# Patient Record
Sex: Female | Born: 1960 | Race: White | Hispanic: No | Marital: Married | Smoking: Former smoker
Health system: Southern US, Community
[De-identification: ages and names within clinical notes are randomized; demographics above are authoritative.]

## PROBLEM LIST (undated history)

## (undated) DIAGNOSIS — Z973 Presence of spectacles and contact lenses: Secondary | ICD-10-CM

## (undated) DIAGNOSIS — I10 Essential (primary) hypertension: Secondary | ICD-10-CM

## (undated) DIAGNOSIS — E119 Type 2 diabetes mellitus without complications: Secondary | ICD-10-CM

## (undated) DIAGNOSIS — K219 Gastro-esophageal reflux disease without esophagitis: Secondary | ICD-10-CM

## (undated) DIAGNOSIS — Z9889 Other specified postprocedural states: Secondary | ICD-10-CM

## (undated) DIAGNOSIS — R112 Nausea with vomiting, unspecified: Secondary | ICD-10-CM

## (undated) DIAGNOSIS — E7800 Pure hypercholesterolemia, unspecified: Secondary | ICD-10-CM

## (undated) DIAGNOSIS — M5020 Other cervical disc displacement, unspecified cervical region: Secondary | ICD-10-CM

## (undated) HISTORY — PX: COLONOSCOPY W/ BIOPSIES AND POLYPECTOMY: SHX1376

## (undated) HISTORY — PX: FRACTURE SURGERY: SHX138

## (undated) HISTORY — PX: CARPAL TUNNEL RELEASE: SHX101

## (undated) HISTORY — PX: CERVICAL FUSION: SHX112

## (undated) HISTORY — PX: TUBAL LIGATION: SHX77

---

## 2002-10-22 ENCOUNTER — Ambulatory Visit (HOSPITAL_COMMUNITY): Admission: RE | Admit: 2002-10-22 | Discharge: 2002-10-23 | Payer: Self-pay | Admitting: Orthopaedic Surgery

## 2002-10-22 ENCOUNTER — Encounter: Payer: Self-pay | Admitting: Orthopaedic Surgery

## 2002-10-23 ENCOUNTER — Encounter: Payer: Self-pay | Admitting: Orthopaedic Surgery

## 2003-04-30 ENCOUNTER — Encounter: Admission: RE | Admit: 2003-04-30 | Discharge: 2003-04-30 | Payer: Self-pay | Admitting: Obstetrics and Gynecology

## 2004-09-12 ENCOUNTER — Ambulatory Visit (HOSPITAL_COMMUNITY): Admission: RE | Admit: 2004-09-12 | Discharge: 2004-09-12 | Payer: Self-pay | Admitting: Orthopaedic Surgery

## 2005-10-09 ENCOUNTER — Encounter: Admission: RE | Admit: 2005-10-09 | Discharge: 2005-10-09 | Payer: Self-pay | Admitting: Orthopedic Surgery

## 2007-02-01 IMAGING — CT CT EXTREM LOW W/O CM*L*
3 series · 16 of 35 positions shown, 19 images · IV contrast (agent unspecified)
Comparison: none

CLINICAL DATA: Plantar surface left great toe pain at the MTP joint for one year. 
LEFT ANKLE/FOOT CT ? NO CONTRAST:
TECHNIQUE: Technique:  Multidetector CT imaging was performed according to the standard protocol.  Multiplanar CT image reconstructions were also generated.
No comparison.

[Series 4: recon 3: left foot · axial · 0.31mm/px · z∈[-271,-72]mm · 8 of 377 slices shown, 10 images]
[im 29/377  soft-tissue]
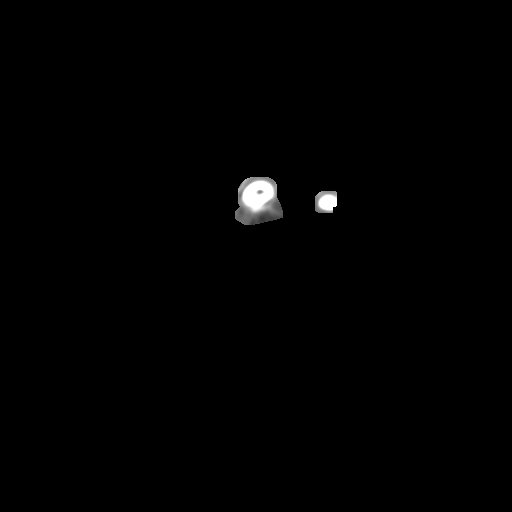
[im 29/377  bone]
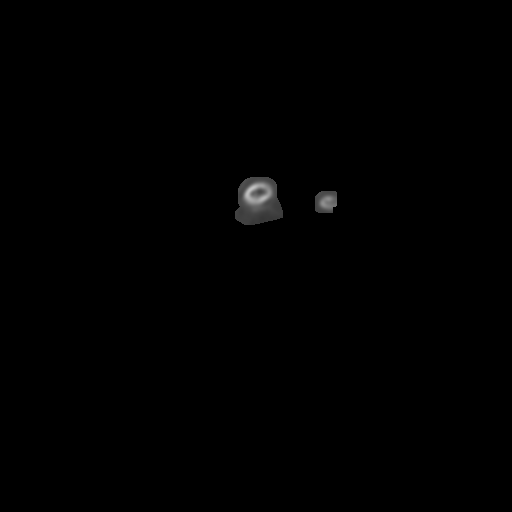
[im 87/377  bone]
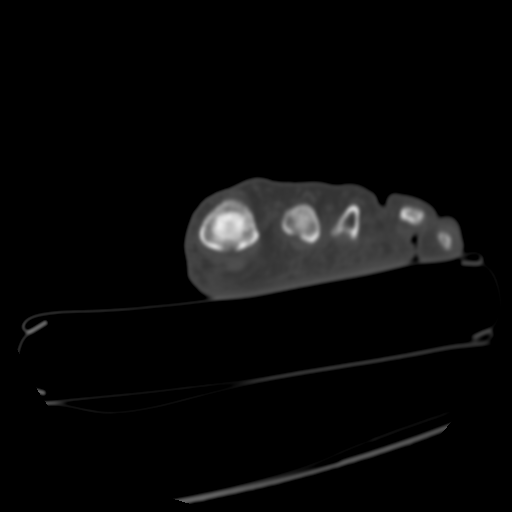
[im 116/377  bone]
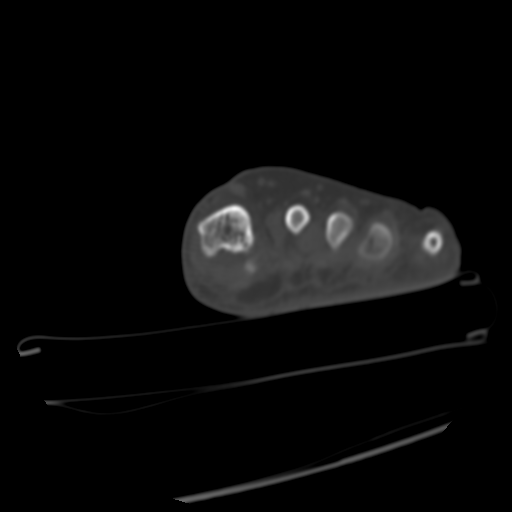
[im 174/377  bone]
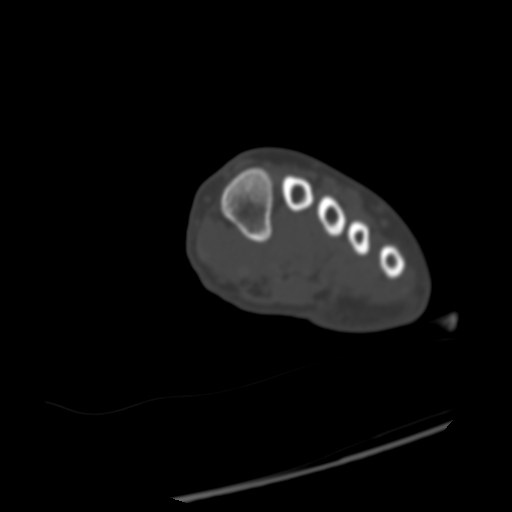
[im 203/377  soft-tissue]
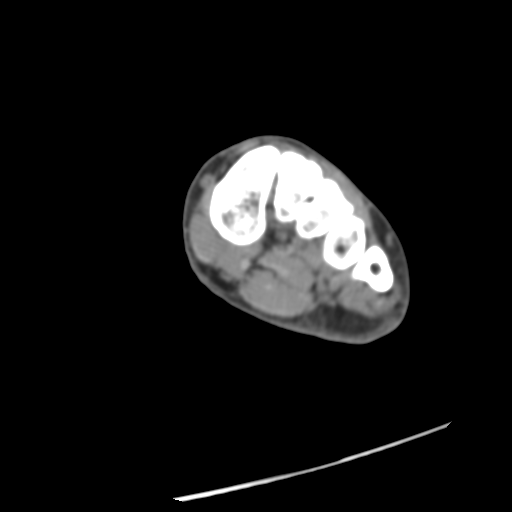
[im 203/377  bone]
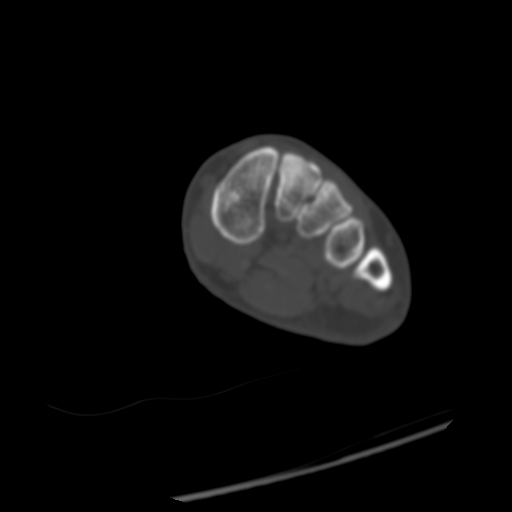
[im 261/377  bone]
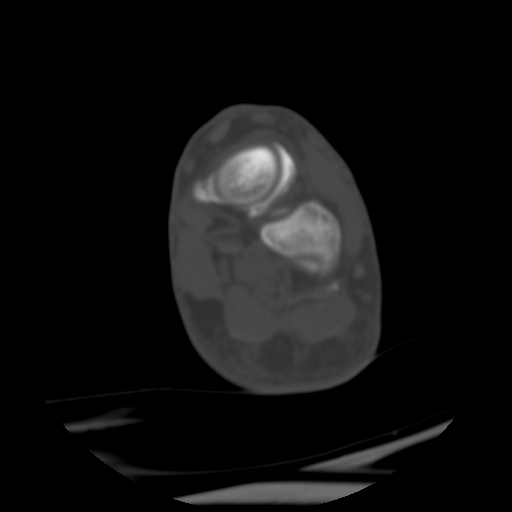
[im 290/377  bone]
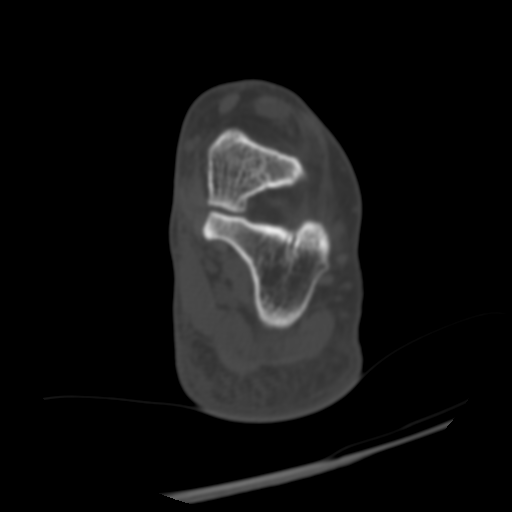
[im 348/377  bone]
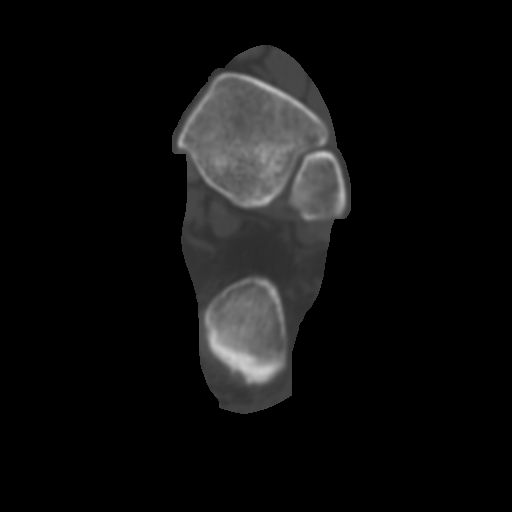

[Series 945: reformatted · coronal · 0.46mm/px · 3 of 69 slices shown (1 of 2)]
[im 14/69  bone]
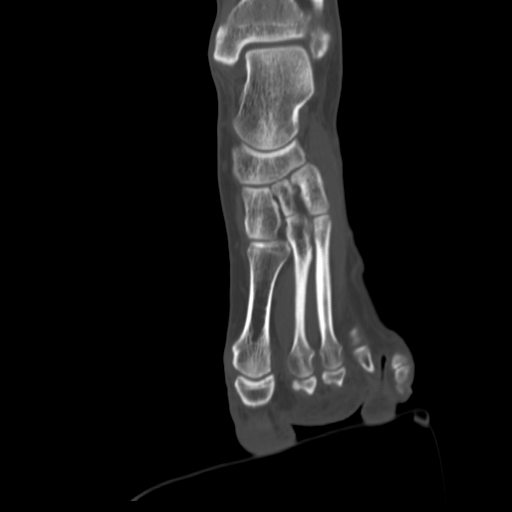
[im 28/69  bone]
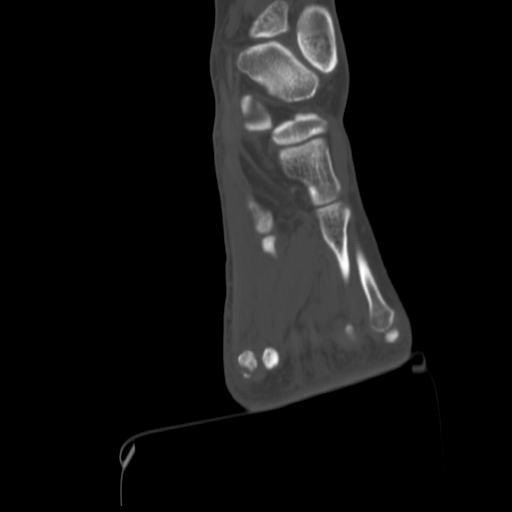
[im 41/69  bone]
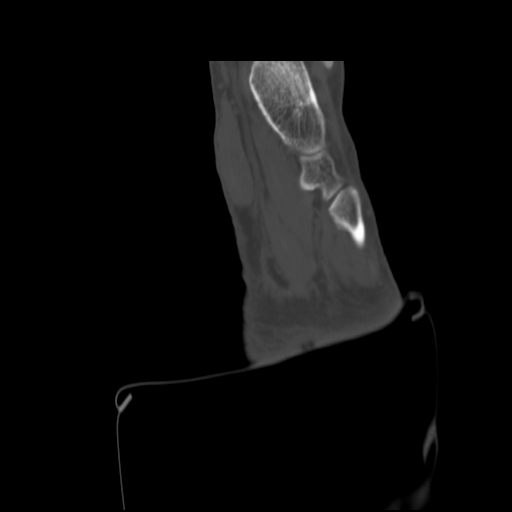

[Series 947: reformatted · sagittal · 0.46mm/px · 5 of 90 slices shown, 6 images (2 of 2)]
[im 30/90  bone]
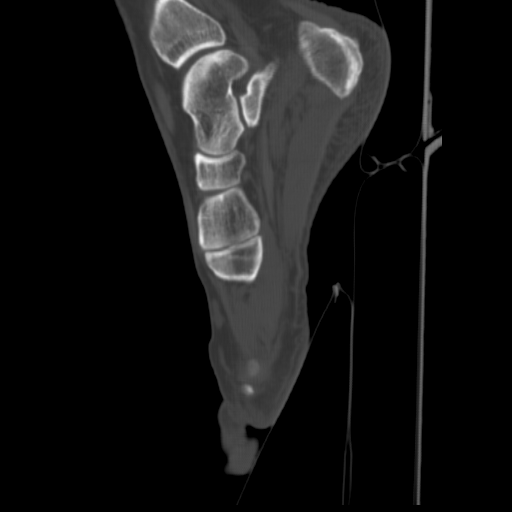
[im 38/90  bone]
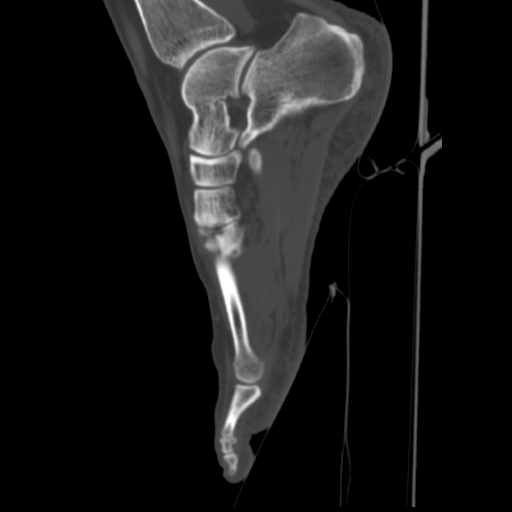
[im 45/90  soft-tissue]
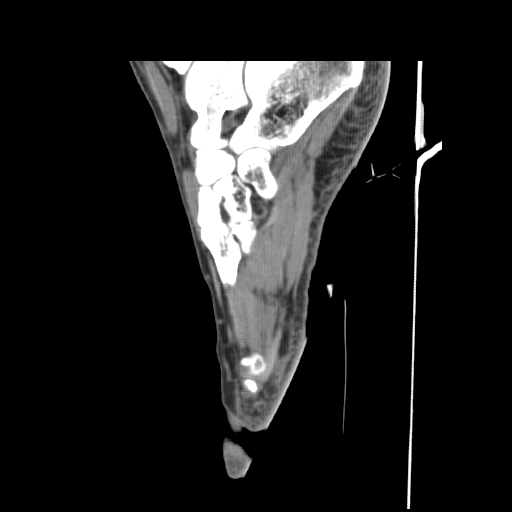
[im 45/90  bone]
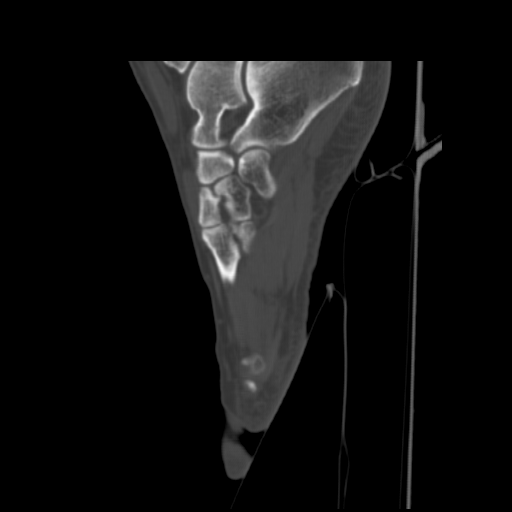
[im 52/90  bone]
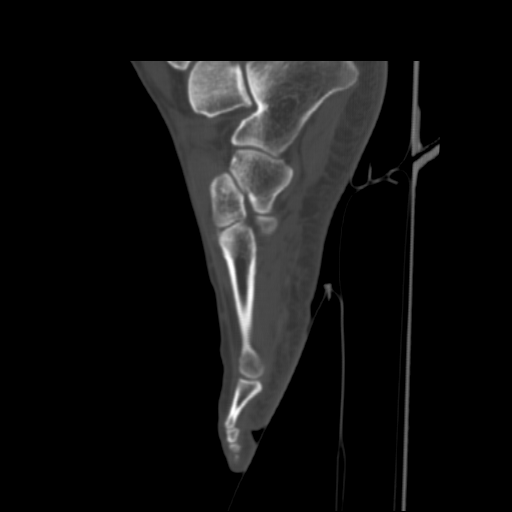
[im 60/90  bone]
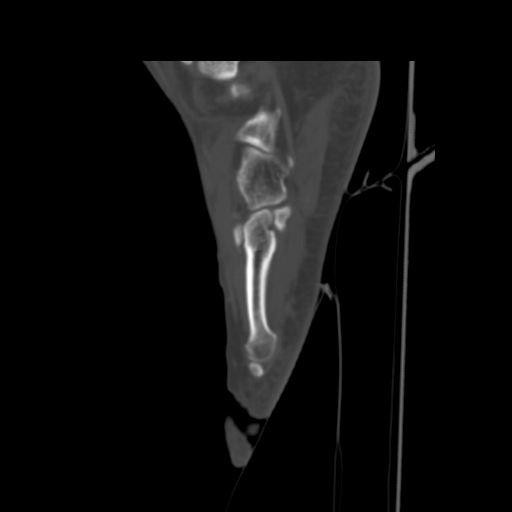

[16 of 35 positions shown; findings below may reference images not displayed]

FINDINGS: Findings consistent with 2 to 3 mm displaced medial sesamoid fracture at the level of plantar surface right first metatarsophalangeal joint is seen.  No smooth cortex for developmental bipartite sesamoid is seen.  No other significant CT abnormality is seen (if great concern for possible so called "turf toe", then consider MRI assessment).  No other significant osseous, articular, nor soft tissue abnormality is seen.
IMPRESSION: 1.  Slightly displaced (2 to 3 mm) medial great toe sesamoid fracture.
2.  Otherwise no significant abnormality.

3-DIMENSIONAL CT IMAGE RENDERING ON INDEPENDENT WORK STATION:
3-dimensional CT images were rendered by post-processing of the original CT data on an independent work station.  The 3-dimensional CT images were interpreted, and findings were reported in the accompanying complete CT report for this study.

## 2015-10-10 ENCOUNTER — Encounter: Payer: Self-pay | Admitting: Gastroenterology

## 2015-10-10 DIAGNOSIS — K648 Other hemorrhoids: Secondary | ICD-10-CM | POA: Diagnosis not present

## 2015-10-10 DIAGNOSIS — Z1211 Encounter for screening for malignant neoplasm of colon: Secondary | ICD-10-CM | POA: Diagnosis not present

## 2015-10-10 DIAGNOSIS — Z7984 Long term (current) use of oral hypoglycemic drugs: Secondary | ICD-10-CM | POA: Diagnosis not present

## 2015-10-10 DIAGNOSIS — E669 Obesity, unspecified: Secondary | ICD-10-CM | POA: Diagnosis not present

## 2015-10-10 DIAGNOSIS — Z6831 Body mass index (BMI) 31.0-31.9, adult: Secondary | ICD-10-CM | POA: Diagnosis not present

## 2015-10-10 DIAGNOSIS — K5730 Diverticulosis of large intestine without perforation or abscess without bleeding: Secondary | ICD-10-CM | POA: Diagnosis not present

## 2015-10-10 DIAGNOSIS — Z8371 Family history of colonic polyps: Secondary | ICD-10-CM | POA: Diagnosis not present

## 2015-10-10 DIAGNOSIS — E119 Type 2 diabetes mellitus without complications: Secondary | ICD-10-CM | POA: Diagnosis not present

## 2015-10-10 DIAGNOSIS — Z86010 Personal history of colonic polyps: Secondary | ICD-10-CM | POA: Diagnosis not present

## 2015-10-10 DIAGNOSIS — D123 Benign neoplasm of transverse colon: Secondary | ICD-10-CM | POA: Diagnosis not present

## 2015-10-10 HISTORY — PX: COLONOSCOPY W/ BIOPSIES AND POLYPECTOMY: SHX1376

## 2015-11-04 DIAGNOSIS — E118 Type 2 diabetes mellitus with unspecified complications: Secondary | ICD-10-CM | POA: Diagnosis not present

## 2015-11-11 DIAGNOSIS — E1165 Type 2 diabetes mellitus with hyperglycemia: Secondary | ICD-10-CM | POA: Diagnosis not present

## 2015-11-11 DIAGNOSIS — E782 Mixed hyperlipidemia: Secondary | ICD-10-CM | POA: Diagnosis not present

## 2015-11-11 DIAGNOSIS — I10 Essential (primary) hypertension: Secondary | ICD-10-CM | POA: Diagnosis not present

## 2015-11-18 DIAGNOSIS — D510 Vitamin B12 deficiency anemia due to intrinsic factor deficiency: Secondary | ICD-10-CM | POA: Diagnosis not present

## 2016-11-26 DIAGNOSIS — Z6830 Body mass index (BMI) 30.0-30.9, adult: Secondary | ICD-10-CM | POA: Diagnosis not present

## 2016-11-26 DIAGNOSIS — S161XXA Strain of muscle, fascia and tendon at neck level, initial encounter: Secondary | ICD-10-CM | POA: Diagnosis not present

## 2016-12-03 DIAGNOSIS — M5021 Other cervical disc displacement,  high cervical region: Secondary | ICD-10-CM | POA: Diagnosis not present

## 2016-12-03 DIAGNOSIS — M50223 Other cervical disc displacement at C6-C7 level: Secondary | ICD-10-CM | POA: Diagnosis not present

## 2016-12-10 ENCOUNTER — Ambulatory Visit (INDEPENDENT_AMBULATORY_CARE_PROVIDER_SITE_OTHER): Payer: BLUE CROSS/BLUE SHIELD | Admitting: Specialist

## 2016-12-10 ENCOUNTER — Encounter (INDEPENDENT_AMBULATORY_CARE_PROVIDER_SITE_OTHER): Payer: Self-pay | Admitting: Specialist

## 2016-12-10 VITALS — BP 135/87 | HR 82 | Ht 67.0 in | Wt 202.0 lb

## 2016-12-10 DIAGNOSIS — M542 Cervicalgia: Secondary | ICD-10-CM

## 2016-12-10 DIAGNOSIS — Z981 Arthrodesis status: Secondary | ICD-10-CM | POA: Diagnosis not present

## 2016-12-10 DIAGNOSIS — M4722 Other spondylosis with radiculopathy, cervical region: Secondary | ICD-10-CM | POA: Diagnosis not present

## 2016-12-10 DIAGNOSIS — R200 Anesthesia of skin: Secondary | ICD-10-CM

## 2016-12-10 DIAGNOSIS — R202 Paresthesia of skin: Secondary | ICD-10-CM

## 2016-12-10 MED ORDER — NAPROXEN 500 MG PO TABS: 500.0000 mg | ORAL_TABLET | Freq: Two times a day (BID) | ORAL | 1 refills | Status: DC

## 2016-12-10 MED ORDER — GABAPENTIN 100 MG PO CAPS: 100.0000 mg | ORAL_CAPSULE | Freq: Three times a day (TID) | ORAL | 1 refills | Status: DC

## 2016-12-10 MED ORDER — METAXALONE 800 MG PO TABS: 400.0000 mg | ORAL_TABLET | Freq: Three times a day (TID) | ORAL | 0 refills | Status: AC
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
## 2016-12-21 ENCOUNTER — Telehealth (INDEPENDENT_AMBULATORY_CARE_PROVIDER_SITE_OTHER): Payer: Self-pay

## 2016-12-21 NOTE — Telephone Encounter (Signed)
Ruth Martin with [facility].  Would like a copy of patient's last office visit and copy of the order with the ICD 10 codes faxed to [phone].  Please fax by Tuesday patient has appointment on Wednesday.  Please advise. Thank You.

## 2016-12-24 NOTE — Telephone Encounter (Signed)
Please fax to them, thanks.

## 2016-12-24 NOTE — Telephone Encounter (Signed)
refaxed last ov notes, PT order (which has the ICD 10 on it) and demographic to [phone]

## 2016-12-25 DIAGNOSIS — M25611 Stiffness of right shoulder, not elsewhere classified: Secondary | ICD-10-CM | POA: Diagnosis not present

## 2016-12-25 DIAGNOSIS — M542 Cervicalgia: Secondary | ICD-10-CM | POA: Diagnosis not present

## 2016-12-25 DIAGNOSIS — M4722 Other spondylosis with radiculopathy, cervical region: Secondary | ICD-10-CM | POA: Diagnosis not present

## 2016-12-25 DIAGNOSIS — M2560 Stiffness of unspecified joint, not elsewhere classified: Secondary | ICD-10-CM | POA: Diagnosis not present

## 2016-12-25 DIAGNOSIS — M6281 Muscle weakness (generalized): Secondary | ICD-10-CM | POA: Diagnosis not present

## 2016-12-25 DIAGNOSIS — R293 Abnormal posture: Secondary | ICD-10-CM | POA: Diagnosis not present

## 2016-12-25 DIAGNOSIS — R201 Hypoesthesia of skin: Secondary | ICD-10-CM | POA: Diagnosis not present

## 2016-12-25 DIAGNOSIS — M25612 Stiffness of left shoulder, not elsewhere classified: Secondary | ICD-10-CM | POA: Diagnosis not present

## 2016-12-25 DIAGNOSIS — M79602 Pain in left arm: Secondary | ICD-10-CM | POA: Diagnosis not present

## 2016-12-27 DIAGNOSIS — M79602 Pain in left arm: Secondary | ICD-10-CM | POA: Diagnosis not present

## 2016-12-27 DIAGNOSIS — M542 Cervicalgia: Secondary | ICD-10-CM | POA: Diagnosis not present

## 2016-12-27 DIAGNOSIS — R293 Abnormal posture: Secondary | ICD-10-CM | POA: Diagnosis not present

## 2016-12-27 DIAGNOSIS — M4722 Other spondylosis with radiculopathy, cervical region: Secondary | ICD-10-CM | POA: Diagnosis not present

## 2016-12-27 DIAGNOSIS — M2560 Stiffness of unspecified joint, not elsewhere classified: Secondary | ICD-10-CM | POA: Diagnosis not present

## 2016-12-27 DIAGNOSIS — M25611 Stiffness of right shoulder, not elsewhere classified: Secondary | ICD-10-CM | POA: Diagnosis not present

## 2016-12-27 DIAGNOSIS — M6281 Muscle weakness (generalized): Secondary | ICD-10-CM | POA: Diagnosis not present

## 2016-12-27 DIAGNOSIS — R201 Hypoesthesia of skin: Secondary | ICD-10-CM | POA: Diagnosis not present

## 2016-12-27 DIAGNOSIS — M25612 Stiffness of left shoulder, not elsewhere classified: Secondary | ICD-10-CM | POA: Diagnosis not present

## 2016-12-31 DIAGNOSIS — M4722 Other spondylosis with radiculopathy, cervical region: Secondary | ICD-10-CM | POA: Diagnosis not present

## 2016-12-31 DIAGNOSIS — R201 Hypoesthesia of skin: Secondary | ICD-10-CM | POA: Diagnosis not present

## 2016-12-31 DIAGNOSIS — M25611 Stiffness of right shoulder, not elsewhere classified: Secondary | ICD-10-CM | POA: Diagnosis not present

## 2016-12-31 DIAGNOSIS — M542 Cervicalgia: Secondary | ICD-10-CM | POA: Diagnosis not present

## 2016-12-31 DIAGNOSIS — R293 Abnormal posture: Secondary | ICD-10-CM | POA: Diagnosis not present

## 2016-12-31 DIAGNOSIS — M25612 Stiffness of left shoulder, not elsewhere classified: Secondary | ICD-10-CM | POA: Diagnosis not present

## 2016-12-31 DIAGNOSIS — M6281 Muscle weakness (generalized): Secondary | ICD-10-CM | POA: Diagnosis not present

## 2016-12-31 DIAGNOSIS — M79602 Pain in left arm: Secondary | ICD-10-CM | POA: Diagnosis not present

## 2016-12-31 DIAGNOSIS — M2560 Stiffness of unspecified joint, not elsewhere classified: Secondary | ICD-10-CM | POA: Diagnosis not present

## 2017-01-03 DIAGNOSIS — R293 Abnormal posture: Secondary | ICD-10-CM | POA: Diagnosis not present

## 2017-01-03 DIAGNOSIS — M25612 Stiffness of left shoulder, not elsewhere classified: Secondary | ICD-10-CM | POA: Diagnosis not present

## 2017-01-03 DIAGNOSIS — M25611 Stiffness of right shoulder, not elsewhere classified: Secondary | ICD-10-CM | POA: Diagnosis not present

## 2017-01-03 DIAGNOSIS — M6281 Muscle weakness (generalized): Secondary | ICD-10-CM | POA: Diagnosis not present

## 2017-01-03 DIAGNOSIS — M542 Cervicalgia: Secondary | ICD-10-CM | POA: Diagnosis not present

## 2017-01-03 DIAGNOSIS — R201 Hypoesthesia of skin: Secondary | ICD-10-CM | POA: Diagnosis not present

## 2017-01-03 DIAGNOSIS — M4722 Other spondylosis with radiculopathy, cervical region: Secondary | ICD-10-CM | POA: Diagnosis not present

## 2017-01-03 DIAGNOSIS — M79602 Pain in left arm: Secondary | ICD-10-CM | POA: Diagnosis not present

## 2017-01-03 DIAGNOSIS — M2560 Stiffness of unspecified joint, not elsewhere classified: Secondary | ICD-10-CM | POA: Diagnosis not present

## 2017-01-07 DIAGNOSIS — R201 Hypoesthesia of skin: Secondary | ICD-10-CM | POA: Diagnosis not present

## 2017-01-07 DIAGNOSIS — R293 Abnormal posture: Secondary | ICD-10-CM | POA: Diagnosis not present

## 2017-01-07 DIAGNOSIS — M542 Cervicalgia: Secondary | ICD-10-CM | POA: Diagnosis not present

## 2017-01-07 DIAGNOSIS — M2560 Stiffness of unspecified joint, not elsewhere classified: Secondary | ICD-10-CM | POA: Diagnosis not present

## 2017-01-07 DIAGNOSIS — M6281 Muscle weakness (generalized): Secondary | ICD-10-CM | POA: Diagnosis not present

## 2017-01-07 DIAGNOSIS — M25611 Stiffness of right shoulder, not elsewhere classified: Secondary | ICD-10-CM | POA: Diagnosis not present

## 2017-01-07 DIAGNOSIS — M79602 Pain in left arm: Secondary | ICD-10-CM | POA: Diagnosis not present

## 2017-01-07 DIAGNOSIS — M25612 Stiffness of left shoulder, not elsewhere classified: Secondary | ICD-10-CM | POA: Diagnosis not present

## 2017-01-07 DIAGNOSIS — M4722 Other spondylosis with radiculopathy, cervical region: Secondary | ICD-10-CM | POA: Diagnosis not present

## 2017-01-10 DIAGNOSIS — M79602 Pain in left arm: Secondary | ICD-10-CM | POA: Diagnosis not present

## 2017-01-10 DIAGNOSIS — M6281 Muscle weakness (generalized): Secondary | ICD-10-CM | POA: Diagnosis not present

## 2017-01-10 DIAGNOSIS — R293 Abnormal posture: Secondary | ICD-10-CM | POA: Diagnosis not present

## 2017-01-10 DIAGNOSIS — M25612 Stiffness of left shoulder, not elsewhere classified: Secondary | ICD-10-CM | POA: Diagnosis not present

## 2017-01-10 DIAGNOSIS — M542 Cervicalgia: Secondary | ICD-10-CM | POA: Diagnosis not present

## 2017-01-10 DIAGNOSIS — M2560 Stiffness of unspecified joint, not elsewhere classified: Secondary | ICD-10-CM | POA: Diagnosis not present

## 2017-01-10 DIAGNOSIS — R201 Hypoesthesia of skin: Secondary | ICD-10-CM | POA: Diagnosis not present

## 2017-01-10 DIAGNOSIS — M25611 Stiffness of right shoulder, not elsewhere classified: Secondary | ICD-10-CM | POA: Diagnosis not present

## 2017-01-10 DIAGNOSIS — M4722 Other spondylosis with radiculopathy, cervical region: Secondary | ICD-10-CM | POA: Diagnosis not present

## 2017-01-11 DIAGNOSIS — Z1151 Encounter for screening for human papillomavirus (HPV): Secondary | ICD-10-CM | POA: Diagnosis not present

## 2017-01-11 DIAGNOSIS — Z01419 Encounter for gynecological examination (general) (routine) without abnormal findings: Secondary | ICD-10-CM | POA: Diagnosis not present

## 2017-01-11 DIAGNOSIS — N951 Menopausal and female climacteric states: Secondary | ICD-10-CM | POA: Diagnosis not present

## 2017-01-11 DIAGNOSIS — Z1231 Encounter for screening mammogram for malignant neoplasm of breast: Secondary | ICD-10-CM | POA: Diagnosis not present

## 2017-01-16 DIAGNOSIS — M2560 Stiffness of unspecified joint, not elsewhere classified: Secondary | ICD-10-CM | POA: Diagnosis not present

## 2017-01-16 DIAGNOSIS — M6281 Muscle weakness (generalized): Secondary | ICD-10-CM | POA: Diagnosis not present

## 2017-01-16 DIAGNOSIS — M542 Cervicalgia: Secondary | ICD-10-CM | POA: Diagnosis not present

## 2017-01-16 DIAGNOSIS — R201 Hypoesthesia of skin: Secondary | ICD-10-CM | POA: Diagnosis not present

## 2017-01-16 DIAGNOSIS — M25612 Stiffness of left shoulder, not elsewhere classified: Secondary | ICD-10-CM | POA: Diagnosis not present

## 2017-01-16 DIAGNOSIS — M79602 Pain in left arm: Secondary | ICD-10-CM | POA: Diagnosis not present

## 2017-01-16 DIAGNOSIS — R293 Abnormal posture: Secondary | ICD-10-CM | POA: Diagnosis not present

## 2017-01-16 DIAGNOSIS — M4722 Other spondylosis with radiculopathy, cervical region: Secondary | ICD-10-CM | POA: Diagnosis not present

## 2017-01-16 DIAGNOSIS — M25611 Stiffness of right shoulder, not elsewhere classified: Secondary | ICD-10-CM | POA: Diagnosis not present

## 2017-01-17 ENCOUNTER — Ambulatory Visit (INDEPENDENT_AMBULATORY_CARE_PROVIDER_SITE_OTHER): Payer: BLUE CROSS/BLUE SHIELD | Admitting: Surgery

## 2017-01-17 ENCOUNTER — Encounter (INDEPENDENT_AMBULATORY_CARE_PROVIDER_SITE_OTHER): Payer: Self-pay | Admitting: Surgery

## 2017-01-17 VITALS — BP 130/75 | HR 74 | Ht 67.0 in | Wt 204.0 lb

## 2017-01-17 DIAGNOSIS — G5692 Unspecified mononeuropathy of left upper limb: Secondary | ICD-10-CM

## 2017-01-17 DIAGNOSIS — M5412 Radiculopathy, cervical region: Secondary | ICD-10-CM
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
## 2017-01-25 DIAGNOSIS — M791 Myalgia: Secondary | ICD-10-CM | POA: Diagnosis not present

## 2017-01-31 ENCOUNTER — Ambulatory Visit (INDEPENDENT_AMBULATORY_CARE_PROVIDER_SITE_OTHER): Payer: BLUE CROSS/BLUE SHIELD | Admitting: Physical Medicine and Rehabilitation

## 2017-01-31 ENCOUNTER — Encounter (INDEPENDENT_AMBULATORY_CARE_PROVIDER_SITE_OTHER): Payer: Self-pay | Admitting: Physical Medicine and Rehabilitation

## 2017-01-31 DIAGNOSIS — R202 Paresthesia of skin: Secondary | ICD-10-CM

## 2017-01-31 DIAGNOSIS — Z23 Encounter for immunization: Secondary | ICD-10-CM | POA: Diagnosis not present
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
## 2017-02-04 ENCOUNTER — Encounter (INDEPENDENT_AMBULATORY_CARE_PROVIDER_SITE_OTHER): Payer: Self-pay | Admitting: Physical Medicine and Rehabilitation

## 2017-02-04 ENCOUNTER — Encounter (INDEPENDENT_AMBULATORY_CARE_PROVIDER_SITE_OTHER): Payer: Self-pay | Admitting: Specialist

## 2017-02-04 ENCOUNTER — Ambulatory Visit (INDEPENDENT_AMBULATORY_CARE_PROVIDER_SITE_OTHER): Payer: BLUE CROSS/BLUE SHIELD | Admitting: Specialist

## 2017-02-04 VITALS — BP 110/76 | HR 99 | Ht 67.0 in | Wt 204.0 lb

## 2017-02-04 DIAGNOSIS — M5020 Other cervical disc displacement, unspecified cervical region: Secondary | ICD-10-CM

## 2017-02-04 DIAGNOSIS — M5412 Radiculopathy, cervical region: Secondary | ICD-10-CM | POA: Diagnosis not present

## 2017-02-04 MED ORDER — HYDROCODONE-ACETAMINOPHEN 5-325 MG PO TABS: 1.0000 | ORAL_TABLET | Freq: Four times a day (QID) | ORAL | 0 refills | Status: AC | PRN
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
## 2017-02-04 NOTE — Procedures (Signed)
EMG & NCV Findings: All nerve conduction studies (as indicated in the following tables) were within normal limits.    All examined muscles (as indicated in the following table) showed no evidence of electrical instability.    Impression: Essentially NORMAL electrodiagnostic study of the left upper limb.  There is no significant electrodiagnostic evidence of nerve entrapment, brachial plexopathy or cervical radiculopathy.    As you know, purely sensory or demyelinating radiculopathies and chemical radiculitis may not be detected with this particular electrodiagnostic study.  Recommendations: 1.  Follow-up with referring physician.   Nerve Conduction Studies Anti Sensory Summary Table   Stim Site NR Peak (ms) Norm Peak (ms) P-T Amp (V) Norm P-T Amp Site1 Site2 Delta-P (ms) Dist (cm) Vel (m/s) Norm Vel (m/s)  Left Median Acr Palm Anti Sensory (2nd Digit)  31.8C  Wrist    3.4 <3.6 40.9 >10 Wrist Palm 1.5 0.0    Palm    1.9 <2.0 40.8         Left Radial Anti Sensory (Base 1st Digit)  31.8C  Wrist    2.3 <3.1 17.5  Wrist Base 1st Digit 2.3 0.0    Left Ulnar Anti Sensory (5th Digit)  32.1C  Wrist    3.5 <3.7 19.1 >15.0 Wrist 5th Digit 3.5 14.0 40 >38   Motor Summary Table   Stim Site NR Onset (ms) Norm Onset (ms) O-P Amp (mV) Norm O-P Amp Site1 Site2 Delta-0 (ms) Dist (cm) Vel (m/s) Norm Vel (m/s)  Left Median Motor (Abd Poll Brev)  32C  Wrist    3.2 <4.2 7.4 >5 Elbow Wrist 3.5 19.5 56 >50  Elbow    6.7  7.5         Left Ulnar Motor (Abd Dig Min)  32.1C  Wrist    3.0 <4.2 11.0 >3 B Elbow Wrist 2.9 19.5 67 >53  B Elbow    5.9  10.8  A Elbow B Elbow 1.3 10.0 77 >53  A Elbow    7.2  10.6          EMG   Side Muscle Nerve Root Ins Act Fibs Psw Amp Dur Poly Recrt Int Dennie Bible Comment  Left 1stDorInt Ulnar C8-T1 Nml Nml Nml Nml Nml 0 Nml Nml   Left Abd Poll Brev Median C8-T1 Nml Nml Nml Nml Nml 0 Nml Nml   Left ExtDigCom   Nml Nml Nml Nml Nml 0 Nml Nml   Left Triceps Radial C6-7-8 Nml  Nml Nml Nml Nml 0 Nml Nml   Left Deltoid Axillary C5-6 Nml Nml Nml Nml Nml 0 Nml Nml     Nerve Conduction Studies Anti Sensory Left/Right Comparison   Stim Site L Lat (ms) R Lat (ms) L-R Lat (ms) L Amp (V) R Amp (V) L-R Amp (%) Site1 Site2 L Vel (m/s) R Vel (m/s) L-R Vel (m/s)  Median Acr Palm Anti Sensory (2nd Digit)  31.8C  Wrist 3.4   40.9   Wrist Palm     Palm 1.9   40.8         Radial Anti Sensory (Base 1st Digit)  31.8C  Wrist 2.3   17.5   Wrist Base 1st Digit     Ulnar Anti Sensory (5th Digit)  32.1C  Wrist 3.5   19.1   Wrist 5th Digit 40     Motor Left/Right Comparison   Stim Site L Lat (ms) R Lat (ms) L-R Lat (ms) L Amp (mV) R Amp (mV) L-R Amp (%) Site1  Site2 L Vel (m/s) R Vel (m/s) L-R Vel (m/s)  Median Motor (Abd Poll Brev)  32C  Wrist 3.2   7.4   Elbow Wrist 56    Elbow 6.7   7.5         Ulnar Motor (Abd Dig Min)  32.1C  Wrist 3.0   11.0   B Elbow Wrist 67    B Elbow 5.9   10.8   A Elbow B Elbow 77    A Elbow 7.2   10.6            Waveforms:
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
## 2017-02-08 DIAGNOSIS — Z1231 Encounter for screening mammogram for malignant neoplasm of breast: Secondary | ICD-10-CM | POA: Diagnosis not present

## 2017-02-18 DIAGNOSIS — Z0000 Encounter for general adult medical examination without abnormal findings: Secondary | ICD-10-CM | POA: Diagnosis not present

## 2017-02-18 DIAGNOSIS — Z131 Encounter for screening for diabetes mellitus: Secondary | ICD-10-CM | POA: Diagnosis not present

## 2017-02-20 DIAGNOSIS — I10 Essential (primary) hypertension: Secondary | ICD-10-CM | POA: Diagnosis not present

## 2017-02-20 DIAGNOSIS — E1165 Type 2 diabetes mellitus with hyperglycemia: Secondary | ICD-10-CM | POA: Diagnosis not present

## 2017-02-20 DIAGNOSIS — F418 Other specified anxiety disorders: Secondary | ICD-10-CM | POA: Diagnosis not present

## 2017-02-20 DIAGNOSIS — E782 Mixed hyperlipidemia: Secondary | ICD-10-CM | POA: Diagnosis not present

## 2017-02-24 DIAGNOSIS — M791 Myalgia, unspecified site: Secondary | ICD-10-CM | POA: Diagnosis not present

## 2017-03-06 NOTE — Pre-Procedure Instructions (Signed)
Ruth BullionDeborah J Martin  03/06/2017      [id number] [place] - [address] [facility] [id number] Phone: [phone] Fax: [phone]    Your procedure is scheduled on Monday, March 11, 2017  [facility] Ad[facility] "A" at 5:30 A.M.  Call this number if you have problems the morning of surgery:  [phone]   Remember:  Do not eat food or drink liquids after midnight.  Take these medicines the morning of surgery with A SIP OF WATER: Gabapentin (NEURONTIN), Norethindrone-ethinyl estradiol (FEMHRT LOW DOSE),  Oxybutynin (DITROPAN-XL), and Metaxalone (SKELAXIN). If needed HYDROcodone-acetaminophen (NORCO/VICODIN) for pain.  As of today, stop taking all Aspirins, Vitamins, Fish oils, and Herbal medications. Also stop all NSAIDS i.e. Advil, Ibuprofen, Motrin, Aleve, Anaprox, Naproxen, BC and Goody Powders.  How to Manage Your Diabetes Before and After Surgery Why is it important to control my blood sugar before and after surgery? . Improving blood sugar levels before and after surgery helps healing and can limit problems. . A way of improving blood sugar control is eating a healthy diet by: o  Eating less sugar and carbohydrates o  Increasing activity/exercise o  Talking with your doctor about reaching your blood sugar goals . High blood sugars (greater than 180 mg/dL) can raise your risk of infections and slow your recovery, so you will need to focus on controlling your diabetes during the weeks before surgery. . Make sure that the doctor who takes care of your diabetes knows about your planned surgery including the date and location.  How do I manage my blood sugar before surgery? . Check your blood sugar at least 4 times a day, starting 2 days before surgery, to make sure that the level is not too high or low. o Check your blood sugar the morning of your surgery when you wake up and every 2 hours until you  get to the Short Stay unit. . If your blood sugar is less than 70 mg/dL, you will need to treat for low blood sugar: o Do not take insulin. o Treat a low blood sugar (less than 70 mg/dL) with  cup of clear juice (cranberry or apple), 4 glucose tablets, OR glucose gel. o Recheck blood sugar in 15 minutes after treatment (to make sure it is greater than 70 mg/dL). If your blood sugar is not greater than 70 mg/dL on recheck, call [phone] for further instructions. . Report your blood sugar to the short stay nurse when you get to Short Stay.  . If you are admitted to the hospital after surgery: o Your blood sugar will be checked by the staff and you will probably be given insulin after surgery (instead of oral diabetes medicines) to make sure you have good blood sugar levels. o The goal for blood sugar control after surgery is 80-180 mg/dL.  WHAT DO I DO ABOUT MY DIABETES MEDICATION? Marland Kitchen. Do not take MetFORMIN (GLUCOPHAGE) the morning of surgery.  . If your CBG is greater than 220 mg/dL, call us at [phone]   Do not wear jewelry, make-up or nail polish.  Do not wear lotions, powders, or perfumes, or deoderant.  Do not shave 48 hours prior to surgery.  Men may shave face and neck.  Do not bring valuables to the hospital.  [facility] is [facility] responsible for any belongings or valuables.  Contacts, dentures or bridgework may not be worn into surgery.  Leave your  suitcase in the car.  After surgery it may be brought to your room.  For patients admitted to the hospital, discharge time will be determined by your treatment team.  Patients discharged the day of surgery will not be allowed to drive home.   Special instructions:  [facility]- Preparing For Surgery  Before surgery, you can play an important role. Because skin is not sterile, your skin needs to be as free of germs as possible. You can reduce the number of germs on your skin by washing with CHG (chlorahexidine gluconate) Soap  before surgery.  CHG is an antiseptic cleaner which kills germs and bonds with the skin to continue killing germs even after washing.  Please do not use if you have an allergy to CHG or antibacterial soaps. If your skin becomes reddened/irritated stop using the CHG.  Do not shave (including legs and underarms) for at least 48 hours prior to first CHG shower. It is OK to shave your face.  Please follow these instructions carefully.   1. Shower the NIGHT BEFORE SURGERY and the MORNING OF SURGERY with CHG.   2. If you chose to wash your hair, wash your hair first as usual with your normal shampoo.  3. After you shampoo, rinse your hair and body thoroughly to remove the shampoo.  4. Use CHG as you would any other liquid soap. You can apply CHG directly to the skin and wash gently with a scrungie or a clean washcloth.   5. Apply the CHG Soap to your body ONLY FROM THE NECK DOWN.  Do not use on open wounds or open sores. Avoid contact with your eyes, ears, mouth and genitals (private parts). Wash Face and genitals (private parts)  with your normal soap.  6. Wash thoroughly, paying special attention to the area where your surgery will be performed.  7. Thoroughly rinse your body with warm water from the neck down.  8. DO NOT shower/wash with your normal soap after using and rinsing off the CHG Soap.  9. Pat yourself dry with a CLEAN TOWEL.  10. Wear CLEAN PAJAMAS to bed the night before surgery, wear comfortable clothes the morning of surgery  11. Place CLEAN SHEETS on your bed the night of your first shower and DO NOT SLEEP WITH PETS.  Day of Surgery: Do not apply any deodorants/lotions. Please wear clean clothes to the hospital/surgery center.    Please read over the following fact sheets that you were given. Pain Booklet, Coughing and Deep Breathing, MRSA Information and Surgical Site Infection Prevention

## 2017-03-07 ENCOUNTER — Encounter (HOSPITAL_COMMUNITY)
Admission: RE | Admit: 2017-03-07 | Discharge: 2017-03-07 | Disposition: A | Discharge status: Home / Self Care | Payer: BLUE CROSS/BLUE SHIELD | Source: Ambulatory Visit | Attending: Specialist | Admitting: Specialist

## 2017-03-07 ENCOUNTER — Encounter (HOSPITAL_COMMUNITY): Payer: Self-pay | Admitting: *Deleted

## 2017-03-07 DIAGNOSIS — M50223 Other cervical disc displacement at C6-C7 level: Secondary | ICD-10-CM | POA: Diagnosis not present

## 2017-03-07 DIAGNOSIS — Z01810 Encounter for preprocedural cardiovascular examination: Secondary | ICD-10-CM | POA: Insufficient documentation

## 2017-03-07 DIAGNOSIS — Z01818 Encounter for other preprocedural examination: Secondary | ICD-10-CM | POA: Insufficient documentation

## 2017-03-07 HISTORY — DX: Presence of spectacles and contact lenses: Z97.3

## 2017-03-07 HISTORY — DX: Essential (primary) hypertension: I10

## 2017-03-07 HISTORY — DX: Pure hypercholesterolemia, unspecified: E78.00

## 2017-03-07 HISTORY — DX: Nausea with vomiting, unspecified: R11.2

## 2017-03-07 HISTORY — DX: Other specified postprocedural states: Z98.890

## 2017-03-07 HISTORY — DX: Gastro-esophageal reflux disease without esophagitis: K21.9

## 2017-03-07 HISTORY — DX: Other cervical disc displacement, unspecified cervical region: M50.20

## 2017-03-07 HISTORY — DX: Type 2 diabetes mellitus without complications: E11.9

## 2017-03-07 LAB — CBC
HEMATOCRIT: 40 % (ref 36.0–46.0)
HEMOGLOBIN: 12.9 g/dL (ref 12.0–15.0)
MCH: 27 pg (ref 26.0–34.0)
MCHC: 32.3 g/dL (ref 30.0–36.0)
MCV: 83.9 fL (ref 78.0–100.0)
Platelets: 307 10*3/uL (ref 150–400)
RBC: 4.77 MIL/uL (ref 3.87–5.11)
RDW: 13.4 % (ref 11.5–15.5)
WBC: 9.1 10*3/uL (ref 4.0–10.5)

## 2017-03-07 LAB — COMPREHENSIVE METABOLIC PANEL
ALBUMIN: 4.3 g/dL (ref 3.5–5.0)
ALK PHOS: 62 U/L (ref 38–126)
ALT: 17 U/L (ref 14–54)
ANION GAP: 10 (ref 5–15)
AST: 21 U/L (ref 15–41)
BILIRUBIN TOTAL: 0.6 mg/dL (ref 0.3–1.2)
BUN: 10 mg/dL (ref 6–20)
CALCIUM: 9.3 mg/dL (ref 8.9–10.3)
CO2: 24 mmol/L (ref 22–32)
Chloride: 102 mmol/L (ref 101–111)
Creatinine, Ser: 0.74 mg/dL (ref 0.44–1.00)
GFR calc Af Amer: >60 mL/min (ref >60)
GLUCOSE: 115 mg/dL — AB (ref 65–99)
POTASSIUM: 3.8 mmol/L (ref 3.5–5.1)
Sodium: 136 mmol/L (ref 135–145)
TOTAL PROTEIN: 7.6 g/dL (ref 6.5–8.1)

## 2017-03-07 LAB — PROTIME-INR
INR: 0.95
PROTHROMBIN TIME: 12.6 s (ref 11.4–15.2)

## 2017-03-07 LAB — GLUCOSE, CAPILLARY: Glucose-Capillary: 108 mg/dL — ABNORMAL HIGH (ref 65–99)

## 2017-03-07 LAB — SURGICAL PCR SCREEN
MRSA, PCR: NEGATIVE
STAPHYLOCOCCUS AUREUS: NEGATIVE

## 2017-03-07 LAB — APTT: aPTT: 29 seconds (ref 24–36)

## 2017-03-07 NOTE — Progress Notes (Signed)
Pt denies SOB, chest pain, and being under the care of a cardiologist. Pt denies having a stress test, echo and cardiac cath. Pt denies having an EKG and chest x ray within the last year. Pt A1c result on chart. Pt stated that her fasting blood glucose ranges between 110-120's.

## 2017-03-07 NOTE — Pre-Procedure Instructions (Signed)
Ruth Martin  03/07/2017      [facility] - [place], [place] - [address] [facility] [place] [address] Phone: [phone] Fax: [phone]    Your procedure is scheduled on Monday, March 11, 2017  Report to [facility] Admitting at 5:30 A.M.  Call this number if you have problems the morning of surgery:  [phone]   Remember:  Do not eat food or drink liquids after midnight Sunday, March 10, 2017  Take these medicines the morning of surgery with A SIP OF WATER : gabapentin (NEURONTIN), norethindrone-ethinyl estradiol (FEMHRT), if needed: HYDROcodone for pain  Stop taking Aspirin, vitamins, fish oil and herbal medications. Do not take any NSAIDs ie: Ibuprofen, Advil, Naproxen (Aleve), Motrin or any medication containing Aspirin; stop now.    How to Manage Your Diabetes Before and After Surgery  Why is it important to control my blood sugar before and after surgery? . Improving blood sugar levels before and after surgery helps healing and can limit problems. . A way of improving blood sugar control is eating a healthy diet by: o  Eating less sugar and carbohydrates o  Increasing activity/exercise o  Talking with your doctor about reaching your blood sugar goals . High blood sugars (greater than 180 mg/dL) can raise your risk of infections and slow your recovery, so you will need to focus on controlling your diabetes during the weeks before surgery. . Make sure that the doctor who takes care of your diabetes knows about your planned surgery including the date and location.  How do I manage my blood sugar before surgery? . Check your blood sugar at least 4 times a day, starting 2 days before surgery, to make sure that the level is not too high or low. o Check your blood sugar the morning of your surgery when you wake up and every 2 hours until you get to the Short Stay unit. . If your blood sugar is less than 70 mg/dL, you  will need to treat for low blood sugar: o Do not take insulin. o Treat a low blood sugar (less than 70 mg/dL) with  cup of clear juice (cranberry or apple), 4 glucose tablets, OR glucose gel. o Recheck blood sugar in 15 minutes after treatment (to make sure it is greater than 70 mg/dL). If your blood sugar is not greater than 70 mg/dL on recheck, call [phone] for further instructions. . Report your blood sugar to the short stay nurse when you get to Short Stay.  . If you are admitted to the hospital after surgery: o Your blood sugar will be checked by the staff and you will probably be given insulin after surgery (instead of oral diabetes medicines) to make sure you have good blood sugar levels. o The goal for blood sugar control after surgery is 80-180 mg/dL.  WHAT DO I DO ABOUT MY DIABETES MEDICATION?   Marland Kitchen Do not take oral diabetes medicines (pills) the morning of surgery such as metFORMIN (GLUCOPHAGE)   Reviewed and Endorsed by [facility] Patient Education Committee, August 2015   Do not wear jewelry, make-up or nail polish.  Do not wear lotions, powders, or perfumes, or deoderant.  Do not shave 48 hours prior to surgery.   Do not bring valuables to the hospital.  [facility] is not responsible for any belongings or valuables.  Contacts, dentures or bridgework may not be worn into surgery.  Leave your suitcase in the  car.  After surgery it may be brought to your room. For patients admitted to the hospital, discharge time will be determined by your treatment team. Patients discharged the day of surgery will not be allowed to drive home.   Special instructions:  Shower the night before surgery and the morning of surgery with CHG.  Please read over the following fact sheets that you were given. Pain Booklet, Coughing and Deep Breathing and Surgical Site Infection Prevention

## 2017-03-10 MED ORDER — CEFAZOLIN SODIUM-DEXTROSE 2-4 GM/100ML-% IV SOLN
2.0000 g | INTRAVENOUS | Status: AC
  Administered 2017-03-11: 2 g via INTRAVENOUS
  Filled 2017-03-10: qty 100

## 2017-03-10 NOTE — Anesthesia Preprocedure Evaluation (Addendum)
Anesthesia Evaluation  Patient identified by MRN, date of birth, ID band Patient awake    Reviewed: Allergy & Precautions, NPO status , Patient's Chart, lab work & pertinent test results  History of Anesthesia Complications (+) PONV and history of anesthetic complications  Airway Mallampati: II  TM Distance: >3 FB Neck ROM: Full    Dental no notable dental hx.    Pulmonary neg pulmonary ROS, former smoker,    Pulmonary exam normal breath sounds clear to auscultation       Cardiovascular hypertension, Pt. on medications negative cardio ROS Normal cardiovascular exam Rhythm:Regular Rate:Normal     Neuro/Psych negative neurological ROS  negative psych ROS   GI/Hepatic negative GI ROS, Neg liver ROS, GERD  ,  Endo/Other  negative endocrine ROSdiabetes, Oral Hypoglycemic Agents  Renal/GU negative Renal ROS  negative genitourinary   Musculoskeletal negative musculoskeletal ROS (+)   Abdominal   Peds negative pediatric ROS (+)  Hematology negative hematology ROS (+)   Anesthesia Other Findings EKG 10/18  Normal sinus rhythm Normal ECG   Reproductive/Obstetrics negative OB ROS                             Anesthesia Physical Anesthesia Plan  ASA: II  Anesthesia Plan: General   Post-op Pain Management:    Induction: Intravenous  PONV Risk Score and Plan: 3 and Ondansetron, Dexamethasone, Midazolam, Treatment may vary due to age or medical condition and Scopolamine patch - Pre-op  Airway Management Planned: Oral ETT  Additional Equipment:   Intra-op Plan:   Post-operative Plan: Extubation in OR  Informed Consent:   Plan Discussed with:   Anesthesia Plan Comments: (  )       Anesthesia Quick Evaluation

## 2017-03-11 ENCOUNTER — Encounter (HOSPITAL_COMMUNITY)
Admission: AD | Disposition: A | Discharge status: Home / Self Care | Payer: Self-pay | Source: Ambulatory Visit | Attending: Specialist

## 2017-03-11 ENCOUNTER — Ambulatory Visit (HOSPITAL_COMMUNITY): Payer: BLUE CROSS/BLUE SHIELD | Admitting: Anesthesiology

## 2017-03-11 ENCOUNTER — Observation Stay (HOSPITAL_COMMUNITY)
Admission: AD | Admit: 2017-03-11 | Discharge: 2017-03-12 | Disposition: A | Discharge status: Home / Self Care | Payer: BLUE CROSS/BLUE SHIELD | Source: Ambulatory Visit | Attending: Specialist | Admitting: Specialist

## 2017-03-11 ENCOUNTER — Ambulatory Visit (HOSPITAL_COMMUNITY): Payer: BLUE CROSS/BLUE SHIELD

## 2017-03-11 ENCOUNTER — Encounter (HOSPITAL_COMMUNITY): Payer: Self-pay | Admitting: *Deleted

## 2017-03-11 DIAGNOSIS — Z87891 Personal history of nicotine dependence: Secondary | ICD-10-CM | POA: Diagnosis not present

## 2017-03-11 DIAGNOSIS — M4722 Other spondylosis with radiculopathy, cervical region: Secondary | ICD-10-CM | POA: Diagnosis present

## 2017-03-11 DIAGNOSIS — E7800 Pure hypercholesterolemia, unspecified: Secondary | ICD-10-CM | POA: Diagnosis not present

## 2017-03-11 DIAGNOSIS — Z7984 Long term (current) use of oral hypoglycemic drugs: Secondary | ICD-10-CM | POA: Diagnosis not present

## 2017-03-11 DIAGNOSIS — G9600 Cerebrospinal fluid leak, unspecified: Secondary | ICD-10-CM

## 2017-03-11 DIAGNOSIS — Z885 Allergy status to narcotic agent status: Secondary | ICD-10-CM | POA: Diagnosis not present

## 2017-03-11 DIAGNOSIS — Z8249 Family history of ischemic heart disease and other diseases of the circulatory system: Secondary | ICD-10-CM | POA: Insufficient documentation

## 2017-03-11 DIAGNOSIS — M50123 Cervical disc disorder at C6-C7 level with radiculopathy: Principal | ICD-10-CM | POA: Insufficient documentation

## 2017-03-11 DIAGNOSIS — M50223 Other cervical disc displacement at C6-C7 level: Secondary | ICD-10-CM | POA: Diagnosis not present

## 2017-03-11 DIAGNOSIS — K219 Gastro-esophageal reflux disease without esophagitis: Secondary | ICD-10-CM | POA: Insufficient documentation

## 2017-03-11 DIAGNOSIS — Z981 Arthrodesis status: Secondary | ICD-10-CM | POA: Insufficient documentation

## 2017-03-11 DIAGNOSIS — Z86010 Personal history of colonic polyps: Secondary | ICD-10-CM | POA: Insufficient documentation

## 2017-03-11 DIAGNOSIS — G9741 Accidental puncture or laceration of dura during a procedure: Secondary | ICD-10-CM | POA: Diagnosis not present

## 2017-03-11 DIAGNOSIS — E119 Type 2 diabetes mellitus without complications: Secondary | ICD-10-CM | POA: Insufficient documentation

## 2017-03-11 DIAGNOSIS — R112 Nausea with vomiting, unspecified: Secondary | ICD-10-CM | POA: Diagnosis not present

## 2017-03-11 DIAGNOSIS — Z419 Encounter for procedure for purposes other than remedying health state, unspecified: Secondary | ICD-10-CM

## 2017-03-11 DIAGNOSIS — M5010 Cervical disc disorder with radiculopathy, unspecified cervical region: Secondary | ICD-10-CM | POA: Diagnosis present

## 2017-03-11 DIAGNOSIS — Y658 Other specified misadventures during surgical and medical care: Secondary | ICD-10-CM | POA: Diagnosis not present

## 2017-03-11 DIAGNOSIS — I10 Essential (primary) hypertension: Secondary | ICD-10-CM | POA: Insufficient documentation

## 2017-03-11 DIAGNOSIS — R200 Anesthesia of skin: Secondary | ICD-10-CM | POA: Diagnosis not present

## 2017-03-11 DIAGNOSIS — Z833 Family history of diabetes mellitus: Secondary | ICD-10-CM | POA: Insufficient documentation

## 2017-03-11 HISTORY — PX: POSTERIOR CERVICAL FUSION/FORAMINOTOMY: SHX5038

## 2017-03-11 LAB — GLUCOSE, CAPILLARY
GLUCOSE-CAPILLARY: 82 mg/dL (ref 65–99)
GLUCOSE-CAPILLARY: 162 mg/dL — AB (ref 65–99)
GLUCOSE-CAPILLARY: 173 mg/dL — AB (ref 65–99)
Glucose-Capillary: 153 mg/dL — ABNORMAL HIGH (ref 65–99)

## 2017-03-11 SURGERY — POSTERIOR CERVICAL FUSION/FORAMINOTOMY LEVEL 1
Anesthesia: General | Site: Spine Cervical | Laterality: Left

## 2017-03-11 MED ORDER — LIDOCAINE 2% (20 MG/ML) 5 ML SYRINGE
INTRAMUSCULAR | Status: AC
  Filled 2017-03-11: qty 5

## 2017-03-11 MED ORDER — LACTATED RINGERS IV SOLN
INTRAVENOUS | Status: DC | PRN
  Administered 2017-03-11 (×2): via INTRAVENOUS

## 2017-03-11 MED ORDER — HEMOSTATIC AGENTS (NO CHARGE) OPTIME
TOPICAL | Status: DC | PRN
  Administered 2017-03-11: 1 via TOPICAL

## 2017-03-11 MED ORDER — GLYCOPYRROLATE 0.2 MG/ML IJ SOLN
INTRAMUSCULAR | Status: DC | PRN
  Administered 2017-03-11: 0.1 mg via INTRAVENOUS

## 2017-03-11 MED ORDER — ONDANSETRON HCL 4 MG/2ML IJ SOLN
4.0000 mg | Freq: Four times a day (QID) | INTRAMUSCULAR | Status: DC | PRN
  Administered 2017-03-11 (×2): 4 mg via INTRAVENOUS
  Filled 2017-03-11 (×2): qty 2

## 2017-03-11 MED ORDER — POLYETHYLENE GLYCOL 3350 17 G PO PACK: 17.0000 g | PACK | Freq: Every day | ORAL | Status: DC | PRN

## 2017-03-11 MED ORDER — PROPOFOL 10 MG/ML IV BOLUS
INTRAVENOUS | Status: DC | PRN
  Administered 2017-03-11: 150 mg via INTRAVENOUS

## 2017-03-11 MED ORDER — HYDROCODONE-ACETAMINOPHEN 5-325 MG PO TABS: 1.0000 | ORAL_TABLET | ORAL | Status: DC | PRN

## 2017-03-11 MED ORDER — BUPIVACAINE HCL 0.5 % IJ SOLN
INTRAMUSCULAR | Status: DC | PRN
  Administered 2017-03-11: 20 mL

## 2017-03-11 MED ORDER — LOSARTAN POTASSIUM 50 MG PO TABS
100.0000 mg | ORAL_TABLET | Freq: Every day | ORAL | Status: DC
  Administered 2017-03-12: 100 mg via ORAL
  Filled 2017-03-11: qty 2

## 2017-03-11 MED ORDER — EPHEDRINE SULFATE 50 MG/ML IJ SOLN
INTRAMUSCULAR | Status: DC | PRN
  Administered 2017-03-11 (×2): 10 mg via INTRAVENOUS

## 2017-03-11 MED ORDER — 0.9 % SODIUM CHLORIDE (POUR BTL) OPTIME
TOPICAL | Status: DC | PRN
  Administered 2017-03-11: 1000 mL

## 2017-03-11 MED ORDER — DEXAMETHASONE SODIUM PHOSPHATE 4 MG/ML IJ SOLN
INTRAMUSCULAR | Status: AC
  Filled 2017-03-11: qty 1

## 2017-03-11 MED ORDER — SUGAMMADEX SODIUM 200 MG/2ML IV SOLN
INTRAVENOUS | Status: DC | PRN
  Administered 2017-03-11: 180 mg via INTRAVENOUS

## 2017-03-11 MED ORDER — CEFAZOLIN SODIUM-DEXTROSE 2-4 GM/100ML-% IV SOLN
2.0000 g | Freq: Three times a day (TID) | INTRAVENOUS | Status: AC
  Administered 2017-03-11 – 2017-03-12 (×2): 2 g via INTRAVENOUS
  Filled 2017-03-11 (×2): qty 100

## 2017-03-11 MED ORDER — ONDANSETRON HCL 4 MG/2ML IJ SOLN
INTRAMUSCULAR | Status: AC
  Filled 2017-03-11: qty 2

## 2017-03-11 MED ORDER — PANTOPRAZOLE SODIUM 40 MG IV SOLR
40.0000 mg | Freq: Every day | INTRAVENOUS | Status: DC
  Administered 2017-03-11: 40 mg via INTRAVENOUS
  Filled 2017-03-11: qty 40

## 2017-03-11 MED ORDER — ACETAMINOPHEN 650 MG RE SUPP
650.0000 mg | RECTAL | Status: DC | PRN
Start: 2017-03-11 — End: 2017-03-12

## 2017-03-11 MED ORDER — MEPERIDINE HCL 25 MG/ML IJ SOLN: 6.2500 mg | INTRAMUSCULAR | Status: DC | PRN

## 2017-03-11 MED ORDER — CHLORHEXIDINE GLUCONATE 4 % EX LIQD: 60.0000 mL | Freq: Once | CUTANEOUS | Status: DC

## 2017-03-11 MED ORDER — LORAZEPAM 1 MG PO TABS
1.0000 mg | ORAL_TABLET | Freq: Two times a day (BID) | ORAL | Status: DC | PRN
  Administered 2017-03-12: 1 mg via ORAL
  Filled 2017-03-11: qty 1

## 2017-03-11 MED ORDER — ALBUMIN HUMAN 5 % IV SOLN
INTRAVENOUS | Status: DC | PRN
  Administered 2017-03-11: 09:00:00 via INTRAVENOUS

## 2017-03-11 MED ORDER — ONDANSETRON HCL 4 MG/2ML IJ SOLN
4.0000 mg | Freq: Once | INTRAMUSCULAR | Status: AC | PRN
  Administered 2017-03-11: 4 mg via INTRAVENOUS

## 2017-03-11 MED ORDER — THROMBIN (RECOMBINANT) 20000 UNITS EX SOLR
CUTANEOUS | Status: DC | PRN
  Administered 2017-03-11: 5000 [IU] via TOPICAL

## 2017-03-11 MED ORDER — FENTANYL CITRATE (PF) 100 MCG/2ML IJ SOLN
INTRAMUSCULAR | Status: AC
  Filled 2017-03-11: qty 2

## 2017-03-11 MED ORDER — SODIUM CHLORIDE 0.9% FLUSH: 3.0000 mL | Freq: Two times a day (BID) | INTRAVENOUS | Status: DC

## 2017-03-11 MED ORDER — PAROXETINE HCL 20 MG PO TABS
10.0000 mg | ORAL_TABLET | Freq: Every day | ORAL | Status: DC
  Administered 2017-03-11 – 2017-03-12 (×2): 10 mg via ORAL
  Filled 2017-03-11 (×2): qty 1

## 2017-03-11 MED ORDER — BUPIVACAINE LIPOSOME 1.3 % IJ SUSP
20.0000 mL | INTRAMUSCULAR | Status: AC
  Administered 2017-03-11: 20 mL
  Filled 2017-03-11: qty 20

## 2017-03-11 MED ORDER — ONDANSETRON HCL 4 MG PO TABS: 4.0000 mg | ORAL_TABLET | Freq: Four times a day (QID) | ORAL | Status: DC | PRN

## 2017-03-11 MED ORDER — ATORVASTATIN CALCIUM 10 MG PO TABS
20.0000 mg | ORAL_TABLET | Freq: Every day | ORAL | Status: DC
  Administered 2017-03-11: 20 mg via ORAL
  Filled 2017-03-11: qty 2

## 2017-03-11 MED ORDER — GABAPENTIN 300 MG PO CAPS
300.0000 mg | ORAL_CAPSULE | Freq: Three times a day (TID) | ORAL | Status: DC
  Administered 2017-03-11 – 2017-03-12 (×3): 300 mg via ORAL
  Filled 2017-03-11 (×3): qty 1

## 2017-03-11 MED ORDER — DOCUSATE SODIUM 100 MG PO CAPS
100.0000 mg | ORAL_CAPSULE | Freq: Two times a day (BID) | ORAL | Status: DC
  Administered 2017-03-12: 100 mg via ORAL
  Filled 2017-03-11 (×2): qty 1

## 2017-03-11 MED ORDER — ALUM & MAG HYDROXIDE-SIMETH 200-200-20 MG/5ML PO SUSP: 30.0000 mL | Freq: Four times a day (QID) | ORAL | Status: DC | PRN

## 2017-03-11 MED ORDER — INSULIN ASPART 100 UNIT/ML ~~LOC~~ SOLN
0.0000 [IU] | Freq: Three times a day (TID) | SUBCUTANEOUS | Status: DC
  Administered 2017-03-11: 3 [IU] via SUBCUTANEOUS
  Administered 2017-03-12: 2 [IU] via SUBCUTANEOUS

## 2017-03-11 MED ORDER — THROMBIN (RECOMBINANT) 5000 UNITS EX SOLR
CUTANEOUS | Status: AC
  Filled 2017-03-11: qty 5000

## 2017-03-11 MED ORDER — ESMOLOL HCL 100 MG/10ML IV SOLN
INTRAVENOUS | Status: DC | PRN
  Administered 2017-03-11: 30 mg via INTRAVENOUS

## 2017-03-11 MED ORDER — PHENOL 1.4 % MT LIQD: 1.0000 | OROMUCOSAL | Status: DC | PRN

## 2017-03-11 MED ORDER — SODIUM CHLORIDE 0.9 % IV SOLN: 250.0000 mL | INTRAVENOUS | Status: DC

## 2017-03-11 MED ORDER — ACETAMINOPHEN 325 MG PO TABS
650.0000 mg | ORAL_TABLET | ORAL | Status: DC | PRN
  Administered 2017-03-12 (×2): 650 mg via ORAL
  Filled 2017-03-11 (×2): qty 2

## 2017-03-11 MED ORDER — OXYCODONE HCL ER 10 MG PO T12A
10.0000 mg | EXTENDED_RELEASE_TABLET | Freq: Two times a day (BID) | ORAL | Status: DC
  Administered 2017-03-11 – 2017-03-12 (×2): 10 mg via ORAL
  Filled 2017-03-11 (×2): qty 1

## 2017-03-11 MED ORDER — DEXAMETHASONE SODIUM PHOSPHATE 4 MG/ML IJ SOLN
4.0000 mg | Freq: Four times a day (QID) | INTRAMUSCULAR | Status: AC
  Administered 2017-03-11: 4 mg via INTRAVENOUS

## 2017-03-11 MED ORDER — ONDANSETRON HCL 4 MG/2ML IJ SOLN
INTRAMUSCULAR | Status: DC | PRN
  Administered 2017-03-11 (×2): 4 mg via INTRAVENOUS

## 2017-03-11 MED ORDER — KETOROLAC TROMETHAMINE 15 MG/ML IJ SOLN
15.0000 mg | Freq: Four times a day (QID) | INTRAMUSCULAR | Status: DC
  Administered 2017-03-11 – 2017-03-12 (×3): 15 mg via INTRAVENOUS
  Filled 2017-03-11 (×3): qty 1

## 2017-03-11 MED ORDER — OXYBUTYNIN CHLORIDE ER 5 MG PO TB24
5.0000 mg | ORAL_TABLET | Freq: Every day | ORAL | Status: DC
  Filled 2017-03-11: qty 1

## 2017-03-11 MED ORDER — PHENYLEPHRINE HCL 10 MG/ML IJ SOLN
INTRAVENOUS | Status: DC | PRN
  Administered 2017-03-11: 30 ug/min via INTRAVENOUS

## 2017-03-11 MED ORDER — FENTANYL CITRATE (PF) 250 MCG/5ML IJ SOLN
INTRAMUSCULAR | Status: AC
  Filled 2017-03-11: qty 5

## 2017-03-11 MED ORDER — FLEET ENEMA 7-19 GM/118ML RE ENEM: 1.0000 | ENEMA | Freq: Once | RECTAL | Status: DC | PRN

## 2017-03-11 MED ORDER — ROCURONIUM BROMIDE 100 MG/10ML IV SOLN
INTRAVENOUS | Status: DC | PRN
  Administered 2017-03-11: 50 mg via INTRAVENOUS

## 2017-03-11 MED ORDER — PROPOFOL 10 MG/ML IV BOLUS
INTRAVENOUS | Status: AC
  Filled 2017-03-11: qty 20

## 2017-03-11 MED ORDER — METHOCARBAMOL 500 MG PO TABS: 500.0000 mg | ORAL_TABLET | Freq: Four times a day (QID) | ORAL | Status: DC | PRN

## 2017-03-11 MED ORDER — DEXAMETHASONE 4 MG PO TABS
4.0000 mg | ORAL_TABLET | Freq: Four times a day (QID) | ORAL | Status: AC
  Administered 2017-03-11: 4 mg via ORAL
  Filled 2017-03-11: qty 1

## 2017-03-11 MED ORDER — SODIUM CHLORIDE 0.9% FLUSH: 3.0000 mL | INTRAVENOUS | Status: DC | PRN

## 2017-03-11 MED ORDER — BUPIVACAINE HCL (PF) 0.5 % IJ SOLN
INTRAMUSCULAR | Status: AC
  Filled 2017-03-11: qty 30

## 2017-03-11 MED ORDER — BISACODYL 5 MG PO TBEC: 5.0000 mg | DELAYED_RELEASE_TABLET | Freq: Every day | ORAL | Status: DC | PRN

## 2017-03-11 MED ORDER — NORETHINDRONE-ETH ESTRADIOL 0.5-2.5 MG-MCG PO TABS: 1.0000 | ORAL_TABLET | Freq: Every day | ORAL | Status: DC

## 2017-03-11 MED ORDER — DEXAMETHASONE SODIUM PHOSPHATE 10 MG/ML IJ SOLN
INTRAMUSCULAR | Status: DC | PRN
  Administered 2017-03-11: 10 mg via INTRAVENOUS

## 2017-03-11 MED ORDER — FENTANYL CITRATE (PF) 100 MCG/2ML IJ SOLN
INTRAMUSCULAR | Status: DC | PRN
  Administered 2017-03-11: 150 ug via INTRAVENOUS
  Administered 2017-03-11 (×6): 50 ug via INTRAVENOUS

## 2017-03-11 MED ORDER — MENTHOL 3 MG MT LOZG: 1.0000 | LOZENGE | OROMUCOSAL | Status: DC | PRN

## 2017-03-11 MED ORDER — MIDAZOLAM HCL 2 MG/2ML IJ SOLN
INTRAMUSCULAR | Status: AC
  Filled 2017-03-11: qty 2

## 2017-03-11 MED ORDER — LIDOCAINE HCL (CARDIAC) 20 MG/ML IV SOLN
INTRAVENOUS | Status: DC | PRN
  Administered 2017-03-11: 100 mg via INTRAVENOUS

## 2017-03-11 MED ORDER — GABAPENTIN 100 MG PO CAPS: 100.0000 mg | ORAL_CAPSULE | Freq: Three times a day (TID) | ORAL | Status: DC

## 2017-03-11 MED ORDER — HYDROCODONE-ACETAMINOPHEN 5-325 MG PO TABS
2.0000 | ORAL_TABLET | ORAL | Status: DC | PRN
  Administered 2017-03-11: 2 via ORAL
  Filled 2017-03-11: qty 2

## 2017-03-11 MED ORDER — FENTANYL CITRATE (PF) 100 MCG/2ML IJ SOLN
25.0000 ug | INTRAMUSCULAR | Status: DC | PRN
  Administered 2017-03-11 (×4): 25 ug via INTRAVENOUS

## 2017-03-11 MED ORDER — SODIUM CHLORIDE 0.9 % IV SOLN
INTRAVENOUS | Status: DC
  Administered 2017-03-11: 15:00:00 via INTRAVENOUS

## 2017-03-11 MED ORDER — MIDAZOLAM HCL 5 MG/5ML IJ SOLN
INTRAMUSCULAR | Status: DC | PRN
  Administered 2017-03-11: 2 mg via INTRAVENOUS

## 2017-03-11 MED ORDER — METHOCARBAMOL 1000 MG/10ML IJ SOLN
500.0000 mg | Freq: Four times a day (QID) | INTRAVENOUS | Status: DC | PRN
  Administered 2017-03-11: 500 mg via INTRAVENOUS
  Filled 2017-03-11 (×2): qty 5

## 2017-03-11 SURGICAL SUPPLY — 68 items
ADH SKN CLS APL DERMABOND .7 (GAUZE/BANDAGES/DRESSINGS) ×1
APL SKNCLS STERI-STRIP NONHPOA (GAUZE/BANDAGES/DRESSINGS) ×1
APL SRG 60D 8 XTD TIP BNDBL (TIP) ×1
BENZOIN TINCTURE PRP APPL 2/3 (GAUZE/BANDAGES/DRESSINGS) ×2 IMPLANT
BIT DRILL NEURO 2X3.1 SFT TUCH (MISCELLANEOUS) IMPLANT
BLADE CLIPPER SURG (BLADE) IMPLANT
BUR RND FLUTED 2.5 (BURR) ×2 IMPLANT
CLOSURE STERI-STRIP 1/2X4 (GAUZE/BANDAGES/DRESSINGS) ×1
CLSR STERI-STRIP ANTIMIC 1/2X4 (GAUZE/BANDAGES/DRESSINGS) ×1 IMPLANT
COLLAR CERV LO CONTOUR FIRM DE (SOFTGOODS) ×3 IMPLANT
COVER SURGICAL LIGHT HANDLE (MISCELLANEOUS) ×3 IMPLANT
DERMABOND ADVANCED (GAUZE/BANDAGES/DRESSINGS) ×2
DERMABOND ADVANCED .7 DNX12 (GAUZE/BANDAGES/DRESSINGS) ×1 IMPLANT
DRAPE C-ARM 42X72 X-RAY (DRAPES) ×3 IMPLANT
DRAPE HALF SHEET 40X57 (DRAPES) ×6 IMPLANT
DRAPE LAPAROTOMY T 102X78X121 (DRAPES) ×3 IMPLANT
DRAPE MICROSCOPE LEICA (MISCELLANEOUS) ×2 IMPLANT
DRAPE SURG 17X23 STRL (DRAPES) ×12 IMPLANT
DRILL NEURO 2X3.1 SOFT TOUCH (MISCELLANEOUS)
DRSG MEPILEX BORDER 4X4 (GAUZE/BANDAGES/DRESSINGS) IMPLANT
DRSG MEPILEX BORDER 4X8 (GAUZE/BANDAGES/DRESSINGS) IMPLANT
DRSG PAD ABDOMINAL 8X10 ST (GAUZE/BANDAGES/DRESSINGS) ×6 IMPLANT
DURAPREP 6ML APPLICATOR 50/CS (WOUND CARE) ×3 IMPLANT
DURASEAL APPLICATOR TIP (TIP) ×2 IMPLANT
DURASEAL SPINE SEALANT 3ML (MISCELLANEOUS) ×2 IMPLANT
ELECT CAUTERY BLADE 6.4 (BLADE) ×3 IMPLANT
ELECT REM PT RETURN 9FT ADLT (ELECTROSURGICAL) ×3
ELECTRODE REM PT RTRN 9FT ADLT (ELECTROSURGICAL) ×1 IMPLANT
EVACUATOR 1/8 PVC DRAIN (DRAIN) IMPLANT
GAUZE SPONGE 4X4 12PLY STRL (GAUZE/BANDAGES/DRESSINGS) ×3 IMPLANT
GLOVE BIOGEL PI IND STRL 8 (GLOVE) ×1 IMPLANT
GLOVE BIOGEL PI INDICATOR 8 (GLOVE) ×2
GLOVE ECLIPSE 9.0 STRL (GLOVE) ×3 IMPLANT
GLOVE ORTHO TXT STRL SZ7.5 (GLOVE) ×3 IMPLANT
GLOVE SURG 8.5 LATEX PF (GLOVE) ×3 IMPLANT
GOWN STRL REUS W/ TWL LRG LVL3 (GOWN DISPOSABLE) ×1 IMPLANT
GOWN STRL REUS W/TWL 2XL LVL3 (GOWN DISPOSABLE) ×6 IMPLANT
GOWN STRL REUS W/TWL LRG LVL3 (GOWN DISPOSABLE) ×3
GRAFT DURAGEN MATRIX 1WX1L (Tissue) ×2 IMPLANT
KIT BASIN OR (CUSTOM PROCEDURE TRAY) ×3 IMPLANT
KIT ROOM TURNOVER OR (KITS) ×3 IMPLANT
MATRIX HEMOSTAT SURGIFLO (HEMOSTASIS) ×2 IMPLANT
NDL SPNL 18GX3.5 QUINCKE PK (NEEDLE) ×1 IMPLANT
NEEDLE SPNL 18GX3.5 QUINCKE PK (NEEDLE) ×3 IMPLANT
NS IRRIG 1000ML POUR BTL (IV SOLUTION) ×3 IMPLANT
PACK ORTHO CERVICAL (CUSTOM PROCEDURE TRAY) ×3 IMPLANT
PAD ARMBOARD 7.5X6 YLW CONV (MISCELLANEOUS) ×6 IMPLANT
PATTIES SURGICAL .25X.25 (GAUZE/BANDAGES/DRESSINGS) ×3 IMPLANT
PATTIES SURGICAL .75X.75 (GAUZE/BANDAGES/DRESSINGS) ×2 IMPLANT
SPONGE LAP 18X18 X RAY DECT (DISPOSABLE) ×2 IMPLANT
SPONGE SURGIFOAM ABS GEL 100 (HEMOSTASIS) ×3 IMPLANT
SUT ETHIBOND CT1 BRD #0 30IN (SUTURE) IMPLANT
SUT NURALON 4 0 TR CR/8 (SUTURE) ×2 IMPLANT
SUT VIC AB 0 CT1 27 (SUTURE)
SUT VIC AB 0 CT1 27XBRD ANBCTR (SUTURE) IMPLANT
SUT VIC AB 2-0 CT1 27 (SUTURE)
SUT VIC AB 2-0 CT1 TAPERPNT 27 (SUTURE) IMPLANT
SUT VIC AB 2-0 UR6 27 (SUTURE) IMPLANT
SUT VIC AB 3-0 FS2 27 (SUTURE) ×2 IMPLANT
SUT VIC AB 3-0 X1 27 (SUTURE) ×3 IMPLANT
SUT VICRYL 0 UR6 27IN ABS (SUTURE) ×5 IMPLANT
SYR 10ML LL (SYRINGE) ×2 IMPLANT
TAPE CLOTH 4X10 WHT NS (GAUZE/BANDAGES/DRESSINGS) ×3 IMPLANT
TAPE CLOTH SURG 4X10 WHT LF (GAUZE/BANDAGES/DRESSINGS) ×2 IMPLANT
TOWEL OR 17X24 6PK STRL BLUE (TOWEL DISPOSABLE) ×3 IMPLANT
TOWEL OR 17X26 10 PK STRL BLUE (TOWEL DISPOSABLE) ×3 IMPLANT
TRAY FOLEY W/METER SILVER 16FR (SET/KITS/TRAYS/PACK) ×2 IMPLANT
WATER STERILE IRR 1000ML POUR (IV SOLUTION) ×1 IMPLANT

## 2017-03-11 NOTE — Anesthesia Postprocedure Evaluation (Signed)
Anesthesia Post Note  Patient: Dorise BullionDeborah J Abend  Procedure(s) Performed: LEFT CERVICAL 6-7 FORAMINOTOMY (Left Spine Cervical)     Patient location during evaluation: PACU Anesthesia Type: General Level of consciousness: awake and alert Pain management: pain level controlled Vital Signs Assessment: post-procedure vital signs reviewed and stable Respiratory status: spontaneous breathing, nonlabored ventilation, respiratory function stable and patient connected to nasal cannula oxygen Cardiovascular status: blood pressure returned to baseline and stable Postop Assessment: no apparent nausea or vomiting Anesthetic complications: no    Last Vitals:  Vitals:   03/11/17 1355 03/11/17 1414  BP:  131/67  Pulse: 82 82  Resp: 18 20  Temp: 36.5 C 36.5 C  SpO2: 96% 100%    Last Pain:  Vitals:   03/11/17 1426  TempSrc:   PainSc: 4                  Weber Monnier

## 2017-03-11 NOTE — Op Note (Signed)
03/11/2017  10:58 AM  PATIENT:  Ruth Martin  56 y.o. female  MRN: [id number]  OPERATIVE REPORT  PRE-OPERATIVE DIAGNOSIS:  left C6-7 foraminal herniated nucleus pulposus  POST-OPERATIVE DIAGNOSIS:  left C6-7 foraminal herniated nucleus pulposus  PROCEDURE:  Procedure(s): LEFT CERVICAL 6-7 FORAMINOTOMY    SURGEON:  Jessy Oto, MD     ASSISTANT:  Benjiman Core, PA-C  (Present throughout the entire procedure and necessary for completion of procedure in a timely manner)   EBL: 865HQ  COMPLICATION: Dural tear left C6-7 posterolateral repaired with interrupted 4-0 neurolon, duragen and duraseal.   DRAIN: Foley to SD removed at the end of the case.    ANESTHESIA:  General, supplemented with local  marcaine 0.5% 1:1 exparel 1.3% total of 10 cc, Janeece Riggers, MD.    COMPLICATIONS:  None.   PROCEDURE:The patient was met in the holding area, and the appropriate left C6-7 cervical level identified and marked with "x" and my initials. All questions were answered and informed consent signed.   The patient was then transported to OR. The patient was then placed under general anesthesia without difficulty. A foley catheter was placed sterilely by OR nursing personnel. and transferred to the operating room table prone position Mayfield horseshoe with Oralia Manis. All pressure points well-padded PAS stockings.Shoulders taped down and skin over the posterior inferior aspect of the neck place in traction to decrease skin folds. The patient received appropriate preoperative antibiotic 3 grams ancef. prophylaxis.Time-out procedure was called and correct.   Sterile prep with DuraPrep and draped in the usual manner the shoulders were taped downwards and skin traction over the skin of the neck. Following DuraPrep draped in the usual manner. After timeout protocol, C arm used to localize a spinal needle superficial to the cervical spine directed towards the C7 spinous process, incision was made  approximately C6-7 in the midline. This following infiltration of skin and subcutaneous layers with marcaine 0.5% 1:1 exparel 1.3% total of 10 cc. Incision carried through skin and subcutaneous layers using 10 blade scalpel and electrocautery down to the level ligamentum nuchae. Incision made along the lateral aspect of the spinous process of C7. Clamp then placed at the spinous process of C5, Intraoperative C-arm fluoroscopy identified the clamp at the C5 and the clamp was then placed at C6 and radiographs used to verify this level with Kocher clamp at the C6 spinous process level. Then a 0 Vicryl was used to mark the spinous process of C7. Electrocautery then used to carefully incise the cervical muscles off the left lateral aspect of the spinous process of C6 and C7. Dividing the  spinous muscles off of the inferior aspect lamina at C6 exposing the C6-7 posterior aspect of the interlaminar space. The magnification headlamp were used during this portion procedure. Boss McCollough retractor was inserted. High-speed bur was used to remove a small portion of bone from the inferior aspect of lamina of C6 and the medial 20% of the inferior articular process of C6. During resection of the inferior portion of the left lamina of C6 CSF leak was noted to be present. The area careully covered with a 1cmx1cm cottonoid and the OR microscope sterilely draped and brought into the field. The dural leak was over the left lateral posterior thecal sac and the arachnoid layer was visible, the spinal cord appeared normal. A single 4.0 neurolon suture was placed and used to place tension on the tear edges. The foramenotomy was then carried out. Further resection of the  superior aspect of the lamina right C7 with 85m and 3 mm kerrisons. A 2 mm Kerrison was then used to remove him from superior aspect of the lamina C7 and the medial aspect of the intra-articular process of C6 the 20%, exposing the superior articular process of C7. A 1  mm Kerrison was removed bone off the medial aspect of the superior articular process of C7 resecting 20% of the medial aspect of the superior articular process of C7. Ligamentum flavum then easily lifted superiorly  electrocautery unit cauterizing epidural veins deep to the ligamentum flavum  then resecting the ligamentum flavum. The operating room microscope was draped sterilely and brought into the field. Under the operating room microscope the epidural vein layer overlying the posterior aspect of the thecal sac and the C7 nerve root was then carefully lifted using a micro-titanium nerve hook then cauterized using bipolar electrocautery a 245mkerrison then used to divide this overlying the left C7 nerve root releasing the vascular leash a backward angle 3-0 microcurette then used to remove a small portion of bone off the superior and medial aspect of the pedicle further mobilizing the C7 nerve root bipolar electrocautery to control all bleeding within the axillary area of the C7 nerve. Bone wax was applied to bleeding cancellus bone surfaces are excellent hemostasis obtained  The disc explored using a Penfield 4 found not to be protruded and uncovertebral joint found to be prominent resulting in left C7 foramenal narrowing. Following this then hemostasis was obtained using thrombin-soaked Gelfoam and micro-pledgettes. When complete hemostasis was obtained all gel foam was removed the nerve hook could be easily passed out the neuroforamen without the lateral aspect of the C7 pedicle demonstrate the C7 neuroforamen completely decompressed. Attention then turned to repair of the dural Tear, this was done with multiple 4-0 neurolon sutures and a single purse string of 4-0 neurolon. Valsalva to 30 mm Hg and no leak noted. Duragen was then applied and a layer of duraseal. There was no CSF leak present with valsalva.  Irrigation was carried out no active bleeding was present. The incision was closed by approximating  the ligamentum nuchae with 0 vicryl sutures. The subcutaneous layers approximated with interrupted 0 Vicryl suture more superficial layers with interrupted 2-0 Vicryl sutures and the skin closed with interrupted 4-0 Vicryl sutures. Dermabond was applied then MedPlex bandage. Soft cervical collar placed.  All instrument and sponge counts were correct. Patient was then returned to supine position on her stretcher. Returned to recovery room in satisfactory condition.   Physician assistant's responsibilities: JaBenjiman CorePA-C perform the duties of assistant physician and surgeon during this case present from the beginning of the case to the end of the case. He assisted with careful retraction of neural structures suctioning about her elements including cervical cord and C8 nerve root. Performed closure of the incision on the ligamentum nuchae to the skin and application of dressing. He assisted in positioning the patient had removal the patient from the OR table to the stretcher.   JaBasil Dess0/22/2018, 10:58 AM

## 2017-03-11 NOTE — Anesthesia Procedure Notes (Signed)
Procedure Name: Intubation Date/Time: 03/11/2017 7:41 AM Performed by: Rosiland OzMEYERS, Noach Calvillo Pre-anesthesia Checklist: Patient identified, Emergency Drugs available, Suction available, Patient being monitored and Timeout performed Patient Re-evaluated:Patient Re-evaluated prior to induction Oxygen Delivery Method: Circle system utilized Preoxygenation: Pre-oxygenation with 100% oxygen Induction Type: IV induction Ventilation: Mask ventilation without difficulty Laryngoscope Size: Miller and 3 Grade View: Grade I Tube type: Oral Tube size: 7.0 mm Number of attempts: 1 Airway Equipment and Method: Stylet Placement Confirmation: ETT inserted through vocal cords under direct vision,  positive ETCO2 and breath sounds checked- equal and bilateral Secured at: 21 cm Tube secured with: Tape Dental Injury: Teeth and Oropharynx as per pre-operative assessment

## 2017-03-11 NOTE — Brief Op Note (Addendum)
03/11/2017  10:36 AM  PATIENT:  Ruth Martin  56 y.o. female  PRE-OPERATIVE DIAGNOSIS:  left C6-7 foraminal herniated nucleus pulposus  POST-OPERATIVE DIAGNOSIS:  left C6-7 foraminal herniated nucleus pulposus  PROCEDURE:  Procedure(s): LEFT CERVICAL 6-7 FORAMINOTOMY (Left)  SURGEON:  Surgeon(s) and Role:    * Kerrin ChampagneNitka, Doloros Kwolek E, MD - Primary  PHYSICIAN ASSISTANT: Zonia KiefJames Owens, PA-C  ANESTHESIA:   local and general Bethena MidgetErnest Oddono, MD  EBL: 150CC  BLOOD ADMINISTERED:none  DRAINS: none and Urinary Catheter (Foley) discontinued at the end of the case. LOCAL MEDICATIONS USED:  MARCAINE 0.5% 1:1 EXPAREL 1.3% Amount: 10 ml  COMPLICATION: Dural tear repaired with 4-0 neurolon interrupted and duragen and duraseal.  SPECIMEN:  No Specimen  DISPOSITION OF SPECIMEN:  N/A  COUNTS:  YES  TOURNIQUET:  * No tourniquets in log *  DICTATION: .Dragon Dictation  PLAN OF CARE: Admit for overnight observation  PATIENT DISPOSITION:  PACU - hemodynamically stable.   Delay start of Pharmacological VTE agent (>24hrs) due to surgical blood loss or risk of bleeding: yes

## 2017-03-11 NOTE — Interval H&P Note (Signed)
History and Physical Interval Note:  03/11/2017 7:25 AM  Ruth Martin  has presented today for surgery, with the diagnosis of left C6-7 foraminal herniated nucleus pulposus  The various methods of treatment have been discussed with the patient and family. After consideration of risks, benefits and other options for treatment, the patient has consented to  Procedure(s): LEFT C6-7 FORAMINOTOMY (N/A) as a surgical intervention .  The patient's history has been reviewed, patient examined, no change in status, stable for surgery.  I have reviewed the patient's chart and labs.  Questions were answered to the patient's satisfaction.     Vira BrownsJames Yatzari Jonsson

## 2017-03-11 NOTE — Transfer of Care (Signed)
Immediate Anesthesia Transfer of Care Note  Patient: Dorise BullionDeborah J Macneill  Procedure(s) Performed: LEFT CERVICAL 6-7 FORAMINOTOMY (Left Spine Cervical)  Patient Location: PACU  Anesthesia Type:General  Level of Consciousness: awake and patient cooperative  Airway & Oxygen Therapy: Patient Spontanous Breathing and Patient connected to face mask oxygen  Post-op Assessment: Report given to RN and Post -op Vital signs reviewed and stable  Post vital signs: Reviewed and stable  Last Vitals:  Vitals:   03/11/17 0619  BP: 131/81  Pulse: 67  Resp: 18  Temp: 36.8 C  SpO2: 100%    Last Pain:  Vitals:   03/11/17 0619  TempSrc: Oral  PainSc:          Complications: No apparent anesthesia complications

## 2017-03-11 NOTE — H&P (Signed)
Ruth Martin is an 56 y.o. female.   Chief Complaint: neck pain and left upper extremity radiculopathy  HPI: patient with hx of Left C6-7 HNP and above complaint presents for surgical intervention,  Progressively worsening symptoms.  Failed conservative treatment.   Past Medical History:  Diagnosis Date  . Diabetes mellitus without complication (HCC)   . GERD (gastroesophageal reflux disease)   . HNP (herniated nucleus pulposus), cervical    6-7  . Hypercholesterolemia   . Hypertension   . PONV (postoperative nausea and vomiting)   . Wears glasses     Past Surgical History:  Procedure Laterality Date  . CARPAL TUNNEL RELEASE    . CERVICAL FUSION    . COLONOSCOPY W/ BIOPSIES AND POLYPECTOMY    . FRACTURE SURGERY     left foot  . TUBAL LIGATION      Family History  Problem Relation Age of Onset  . Restless legs syndrome Mother   . Hypertension Father   . Diabetes Father   . Hypercholesterolemia Father   . Hypertension Brother   . Diabetes Brother   . Hypercholesterolemia Brother    Social History:  reports that she has quit smoking. Her smoking use included Cigarettes. She has never used smokeless tobacco. She reports that she does not drink alcohol or use drugs.  Allergies:  Allergies  Allergen Reactions  . Codeine Nausea And Vomiting    No prescriptions prior to admission.    No results found for this or any previous visit (from the past 48 hour(s)). No results found.  Review of Systems  Constitutional: Negative.   HENT: Negative.   Eyes: Negative.   Respiratory: Negative.   Cardiovascular: Negative.   Gastrointestinal: Negative.   Genitourinary: Negative.   Musculoskeletal: Positive for neck pain.  Skin: Negative.   Neurological: Positive for tingling.  Psychiatric/Behavioral: Negative.     There were no vitals taken for this visit. Physical Exam  Constitutional: She is oriented to person, place, and time. She appears well-developed. No  distress.  HENT:  Head: Normocephalic and atraumatic.  Eyes: Pupils are equal, round, and reactive to light. EOM are normal.  Respiratory: No respiratory distress.  GI: She exhibits no distension.  Musculoskeletal:  Back Exam   Tenderness  The patient is experiencing tenderness in the cervical.  Range of Motion  Extension:  60 abnormal  Flexion:  70 abnormal  Lateral Bend Right: 80  Lateral Bend Left:  60 abnormal  Rotation Right: 70  Rotation Left: 60   Muscle Strength  Right Quadriceps:  5/5  Left Quadriceps:  5/5  Right Hamstrings:  5/5  Left Hamstrings:  5/5   Tests  Straight leg raise right: negative Straight leg raise left: negative  Reflexes  Patellar: normal Achilles: normal Biceps: normal Babinski's sign: normal   Other  Toe Walk: normal Heel Walk: normal Sensation: normal Gait: normal   Comments:  Left EDC 4/5, Left triceps 4/5   Neurological: She is alert and oriented to person, place, and time.  Skin: Skin is warm and dry.  Psychiatric: She has a normal mood and affect.     Assessment/Plan Left C6-7 HNP, neck pain and left Upper extremity radiculopathy.    Will proceed with LEFT C6-7 FORAMINOTOMY as scheduled.  Surgical procedure along with possible risks and complications discussed. All questions answered and wishes to proceed.   Zonia KiefJames Owens, PA-C 03/11/2017, 1:32 AM

## 2017-03-11 NOTE — Progress Notes (Signed)
Orthopedic Tech Progress Note Patient Details:  Ruth Martin 03/04/1961 [id number]  Ortho Devices Type of Ortho Device: Soft collar Ortho Device/Splint Location: Pt has soft collar at this time.    Alvina ChouWilliams, Tiffony Kite C 03/11/2017, 3:13 PM

## 2017-03-12 ENCOUNTER — Encounter (HOSPITAL_COMMUNITY): Payer: Self-pay | Admitting: Specialist

## 2017-03-12 DIAGNOSIS — Z86010 Personal history of colonic polyps: Secondary | ICD-10-CM | POA: Diagnosis not present

## 2017-03-12 DIAGNOSIS — Z87891 Personal history of nicotine dependence: Secondary | ICD-10-CM | POA: Diagnosis not present

## 2017-03-12 DIAGNOSIS — I10 Essential (primary) hypertension: Secondary | ICD-10-CM | POA: Diagnosis not present

## 2017-03-12 DIAGNOSIS — G9600 Cerebrospinal fluid leak, unspecified: Secondary | ICD-10-CM

## 2017-03-12 DIAGNOSIS — Z981 Arthrodesis status: Secondary | ICD-10-CM | POA: Diagnosis not present

## 2017-03-12 DIAGNOSIS — R112 Nausea with vomiting, unspecified: Secondary | ICD-10-CM | POA: Diagnosis not present

## 2017-03-12 DIAGNOSIS — G9741 Accidental puncture or laceration of dura during a procedure: Secondary | ICD-10-CM | POA: Diagnosis not present

## 2017-03-12 DIAGNOSIS — Z833 Family history of diabetes mellitus: Secondary | ICD-10-CM | POA: Diagnosis not present

## 2017-03-12 DIAGNOSIS — Z885 Allergy status to narcotic agent status: Secondary | ICD-10-CM | POA: Diagnosis not present

## 2017-03-12 DIAGNOSIS — Z7984 Long term (current) use of oral hypoglycemic drugs: Secondary | ICD-10-CM | POA: Diagnosis not present

## 2017-03-12 DIAGNOSIS — K219 Gastro-esophageal reflux disease without esophagitis: Secondary | ICD-10-CM | POA: Diagnosis not present

## 2017-03-12 DIAGNOSIS — E7800 Pure hypercholesterolemia, unspecified: Secondary | ICD-10-CM | POA: Diagnosis not present

## 2017-03-12 DIAGNOSIS — R200 Anesthesia of skin: Secondary | ICD-10-CM | POA: Diagnosis not present

## 2017-03-12 DIAGNOSIS — M50123 Cervical disc disorder at C6-C7 level with radiculopathy: Secondary | ICD-10-CM | POA: Diagnosis not present

## 2017-03-12 DIAGNOSIS — Z8249 Family history of ischemic heart disease and other diseases of the circulatory system: Secondary | ICD-10-CM | POA: Diagnosis not present

## 2017-03-12 DIAGNOSIS — E119 Type 2 diabetes mellitus without complications: Secondary | ICD-10-CM | POA: Diagnosis not present

## 2017-03-12 LAB — BASIC METABOLIC PANEL
Anion gap: 7 (ref 5–15)
BUN: 9 mg/dL (ref 6–20)
CHLORIDE: 104 mmol/L (ref 101–111)
CO2: 26 mmol/L (ref 22–32)
CREATININE: 0.74 mg/dL (ref 0.44–1.00)
Calcium: 8.5 mg/dL — ABNORMAL LOW (ref 8.9–10.3)
GFR calc non Af Amer: >60 mL/min (ref >60)
GLUCOSE: 147 mg/dL — AB (ref 65–99)
Potassium: 3.8 mmol/L (ref 3.5–5.1)
SODIUM: 137 mmol/L (ref 135–145)

## 2017-03-12 LAB — CBC
HCT: 31.2 % — ABNORMAL LOW (ref 36.0–46.0)
Hemoglobin: 10 g/dL — ABNORMAL LOW (ref 12.0–15.0)
MCH: 26.7 pg (ref 26.0–34.0)
MCHC: 32.1 g/dL (ref 30.0–36.0)
MCV: 83.4 fL (ref 78.0–100.0)
PLATELETS: 296 10*3/uL (ref 150–400)
RBC: 3.74 MIL/uL — ABNORMAL LOW (ref 3.87–5.11)
RDW: 13.1 % (ref 11.5–15.5)
WBC: 13.7 10*3/uL — AB (ref 4.0–10.5)

## 2017-03-12 LAB — HEMOGLOBIN A1C
HEMOGLOBIN A1C: 6.2 % — AB (ref 4.8–5.6)
MEAN PLASMA GLUCOSE: 131 mg/dL

## 2017-03-12 LAB — GLUCOSE, CAPILLARY: GLUCOSE-CAPILLARY: 150 mg/dL — AB (ref 65–99)

## 2017-03-12 MED ORDER — METHOCARBAMOL 500 MG PO TABS: 500.0000 mg | ORAL_TABLET | Freq: Three times a day (TID) | ORAL | 1 refills | Status: DC | PRN

## 2017-03-12 MED ORDER — OXYCODONE HCL ER 10 MG PO T12A: 10.0000 mg | EXTENDED_RELEASE_TABLET | Freq: Two times a day (BID) | ORAL | 0 refills | Status: AC

## 2017-03-12 MED ORDER — HYDROCODONE-ACETAMINOPHEN 5-325 MG PO TABS: 1.0000 | ORAL_TABLET | Freq: Four times a day (QID) | ORAL | 0 refills | Status: AC | PRN

## 2017-03-12 MED ORDER — ONDANSETRON HCL 4 MG PO TABS: 4.0000 mg | ORAL_TABLET | Freq: Four times a day (QID) | ORAL | 0 refills | Status: AC | PRN

## 2017-03-12 MED FILL — Thrombin (Recombinant) For Soln 5000 Unit: CUTANEOUS | Qty: 5000 | Status: AC

## 2017-03-12 NOTE — Progress Notes (Signed)
Physical Therapy Evaluation Patient Details Name: Ruth BullionDeborah J Dechaine MRN: [id number] DOB: 09/21/1960 Today's Date: 03/12/2017   History of Present Illness  s/p LEFT CERVICAL 6-7 FORAMINOTOMY. PMH DM; HTN GERD  Clinical Impression  Evaluated patient and educated her on surgical precautions, bed mobility, and transfers. Demonstrates good prognosis and capacity for return to prior level of function. Further PT services are not recommended at this time. PT will sign off.      Follow Up Recommendations No PT follow up;Supervision - Intermittent    Equipment Recommendations  None recommended by PT    Recommendations for Other Services       Precautions / Restrictions Precautions Precautions: Cervical Precaution Comments: Reviewed precautions with pt and left handout with her Required Braces or Orthoses: Cervical Brace Cervical Brace: Soft collar Restrictions Weight Bearing Restrictions: No      Mobility  Bed Mobility Overal bed mobility: Needs Assistance Bed Mobility: Rolling;Sit to Sidelying Rolling: Supervision Sidelying to sit: Supervision     Sit to sidelying: Supervision General bed mobility comments: supervision for safety and compliance with log roll.  Transfers Overall transfer level: Modified independent Equipment used: None Transfers: Sit to/from Stand Sit to Stand: Modified independent (Device/Increase time)         General transfer comment: no difficulty or concern here  Ambulation/Gait Ambulation/Gait assistance: Modified independent (Device/Increase time) Ambulation Distance (Feet): 500 Feet Assistive device: None Gait Pattern/deviations: Step-through pattern;WFL(Within Functional Limits)     General Gait Details: steady cadence, mildly cautious d/t post operative status. However no significant deficits noted. No instability or LOB noted.  Stairs Stairs: Yes Stairs assistance: Supervision Stair Management: One rail Right;Alternating  pattern Number of Stairs: 10 General stair comments: pt managed stairs with supervision for safety. use of rails but no other physical assist required  Wheelchair Mobility    Modified Rankin (Stroke Patients Only)       Balance Overall balance assessment: Independent Sitting-balance support: No upper extremity supported;Feet supported Sitting balance-Leahy Scale: Good     Standing balance support: No upper extremity supported;During functional activity Standing balance-Leahy Scale: Good                               Pertinent Vitals/Pain Pain Assessment: No/denies pain    Home Living Family/patient expects to be discharged to:: Private residence Living Arrangements: Spouse/significant other Available Help at Discharge: Family;Available 24 hours/day Type of Home: House Home Access: Stairs to enter Entrance Stairs-Rails: Doctor, general practiceight;Left Entrance Stairs-Number of Steps: 2 Home Layout: One level Home Equipment: None      Prior Function Level of Independence: Independent         Comments: works as Engineer, 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     Hand Dominance   Dominant Hand: Right    Extremity/Trunk Assessment   Upper Extremity Assessment Upper Extremity Assessment: Defer to OT evaluation    Lower Extremity Assessment Lower Extremity Assessment: Overall WFL for tasks assessed (strength 5/5, sensation intact to LT)    Cervical / Trunk Assessment Cervical / Trunk Assessment: Other exceptions Cervical / Trunk Exceptions: s/p LEFT CERVICAL 6-7 FORAMINOTOMY  Communication   Communication: No difficulties  Cognition Arousal/Alertness: Awake/alert Behavior During Therapy: WFL for tasks assessed/performed Overall Cognitive Status: Within Functional Limits for tasks assessed  General Comments General comments (skin integrity, edema, etc.): Pt educated on cervical precautions.    Exercises     Assessment/Plan    PT Assessment  Patent does not need any further PT services  PT Problem List         PT Treatment Interventions      PT Goals (Current goals can be found in the Care Plan section)  Acute Rehab PT Goals Patient Stated Goal: To go home  PT Goal Formulation: With patient Time For Goal Achievement: 03/26/17 Potential to Achieve Goals: Good    Frequency     Barriers to discharge        Co-evaluation               AM-PAC PT "6 Clicks" Daily Activity  Outcome Measure Difficulty turning over in bed (including adjusting bedclothes, sheets and blankets)?: A Little Difficulty moving from lying on back to sitting on the side of the bed? : A Little Difficulty sitting down on and standing up from a chair with arms (e.g., wheelchair, bedside commode, etc,.)?: None Help needed moving to and from a bed to chair (including a wheelchair)?: None Help needed walking in hospital room?: None Help needed climbing 3-5 steps with a railing? : A Little 6 Click Score: 21    End of Session Equipment Utilized During Treatment: Gait belt;Cervical collar Activity Tolerance: Patient tolerated treatment well Patient left: in bed;with call bell/phone within reach Nurse Communication: Mobility status PT Visit Diagnosis: Other symptoms and signs involving the nervous system (R29.898)    Time: 8119-1478 PT Time Calculation (min) (ACUTE ONLY): 10 min   Charges:   PT Evaluation $PT Eval Low Complexity: 1 Low     PT G Codes:   PT G-Codes **NOT FOR INPATIENT CLASS** Functional Assessment Tool Used: Clinical judgement Functional Limitation: Mobility: Walking and moving around Mobility: Walking and Moving Around Current Status (G9562): At least 1 percent but less than 20 percent impaired, limited or restricted Mobility: Walking and Moving Around Goal Status [phone]): At least 1 percent but less than 20 percent impaired, limited or restricted Mobility: Walking and Moving Around Discharge Status [phone]): At least 1  percent but less than 20 percent impaired, limited or restricted    [facility], SPT #[phone] office'  Fonnie Birkenhead 03/12/2017, 10:54 AM

## 2017-03-12 NOTE — Progress Notes (Signed)
Patient ambulated in hallway 200 ft. No walker. No issues. Dressing still clean, dry and intact. Pt still reports some resolving tingling in left hand.

## 2017-03-12 NOTE — Progress Notes (Signed)
     Subjective: 1 Day Post-Op Procedure(s) (LRB): LEFT CERVICAL 6-7 FORAMINOTOMY (Left) Awake, alert and oriented x 4. Some tingling left finger tips othewise no numbness or weakness left arm or left leg. Voiding without difficulty. Patient reports pain as moderate.    Objective:   VITALS:  Temp:  [97.7 F (36.5 C)-98.3 F (36.8 C)] 98.2 F (36.8 C) (10/23 0542) Pulse Rate:  [69-113] 74 (10/23 0542) Resp:  [11-22] 18 (10/23 0542) BP: (107-154)/(65-90) 107/69 (10/23 0542) SpO2:  [88 %-100 %] 96 % (10/23 0542)  Neurologically intact ABD soft Neurovascular intact Intact pulses distally Dorsiflexion/Plantar flexion intact Incision: scant drainage   LABS No results for input(s): HGB, WBC, PLT in the last 72 hours. No results for input(s): NA, K, CL, CO2, BUN, CREATININE, GLUCOSE in the last 72 hours. No results for input(s): LABPT, INR in the last 72 hours.   Assessment/Plan: 1 Day Post-Op Procedure(s) (LRB): LEFT CERVICAL 6-7 FORAMINOTOMY (Left)  Advance diet Up with therapy Discharge home with home health  Vira BrownsJames Kyriakos Babler 03/12/2017, 7:45 AM Patient ID: Ruth Martin, female   DOB: 11/27/1960, 56 y.o.   MRN: [id number]

## 2017-03-12 NOTE — Discharge Summary (Addendum)
Physician Discharge Summary      Patient ID: Ruth Martin MRN: [id number] DOB/AGE: 54/05/1960 54 y.o.  Admit date: 03/11/2017 Discharge date: 03/12/2017  Admission Diagnoses:  Principal Problem:   Herniation of cervical intervertebral disc with radiculopathy Active Problems:   Cervical spondylosis with radiculopathy   CSF leak   Discharge Diagnoses:  Same  Past Medical History:  Diagnosis Date  . Diabetes mellitus without complication (HCC)   . GERD (gastroesophageal reflux disease)   . HNP (herniated nucleus pulposus), cervical    6-7  . Hypercholesterolemia   . Hypertension   . PONV (postoperative nausea and vomiting)   . Wears glasses     Surgeries: Procedure(s): LEFT CERVICAL 6-7 FORAMINOTOMY on 03/11/2017   Consultants:   Discharged Condition: Improved  Hospital Course: Ruth BullionDeborah J Martin is an 56 y.o. female who was admitted 03/11/2017 with a chief complaint of No chief complaint on file. , and found to have a diagnosis of Herniation of cervical intervertebral disc with radiculopathy.  She was brought to the operating room on 03/11/2017 and underwent the above named procedures.    She was given perioperative antibiotics:  Anti-infectives    Start     Dose/Rate Route Frequency Ordered Stop   03/11/17 1600  ceFAZolin (ANCEF) IVPB 2g/100 mL premix     2 g 200 mL/hr over 30 Minutes Intravenous Every 8 hours 03/11/17 1424 03/12/17 0043   03/11/17 0700  ceFAZolin (ANCEF) IVPB 2g/100 mL premix     2 g 200 mL/hr over 30 Minutes Intravenous To ShortStay Surgical 03/10/17 1043 03/11/17 0750    Post operatively she was evaluated in the PACU, alert and oriented, motor in both arms and legs was normal. Operative findings and intraoperative dural tear with CSF leak and Repair discussed with this patient's family, her husband in the post op counselling room.Soft c-collar for comfort. HOB elevated due to intraoperative dural leak and its repair. No HAs Though she  did have nausea and an intolerance of narcotic opioids with nausea perioperatively.She was transferred to the 3 west neurosurgical unit where  She was able to stand and to ambulate to the bathroom with assistance. Dressing remained dry with minimal skin Bleeding. Seen on the 3 West unit evening of surgery, neurologically motor normal some complaints of numbness fingertips left hand, operative findings and intraoperative dural tear with CSF leak and repair discussed with this patient .  She tolerated po nourishment. Nausea responded to zofran. She was maintained on 12 hr oxycontin 10 mg  And hydrocodone 5 mg for break through pain. Her VSS. POD#1 she was awake alert and oriented x 4. Dressing changed and there was minimal dry blood on dressing no fluctuance of the incision and no Drainage. She had no head aches or nuchal signs. She was voiding normally. Complained of some numbness of the fingertips of the left  Hand. Was given 2 doses of decadron. Motor in both arms and both legs was normal. She was discharged home on POD#1,  She was given sequential compression devices, early ambulation, and chemoprophylaxis for DVT prophylaxis.  She benefited maximally from their hospital stay and there was intraoperative dural tear with CSF leak and Repair no recurrent leak or sign of CSF leak post operatively.    Recent vital signs:  Vitals:   03/12/17 0542 03/12/17 1115  BP: 107/69 122/64  Pulse: 74 69  Resp: 18 18  Temp: 98.2 F (36.8 C) 98.2 F (36.8 C)  SpO2: 96% 93%    Recent  laboratory studies:  Results for orders placed or performed during the hospital encounter of 03/11/17  Glucose, capillary  Result Value Ref Range   Glucose-Capillary 82 65 - 99 mg/dL  Glucose, capillary  Result Value Ref Range   Glucose-Capillary 153 (H) 65 - 99 mg/dL   Comment 1 Notify RN   Hemoglobin A1c  Result Value Ref Range   Hgb A1c MFr Bld 6.2 (H) 4.8 - 5.6 %   Mean Plasma Glucose 131 mg/dL  Glucose,  capillary  Result Value Ref Range   Glucose-Capillary 162 (H) 65 - 99 mg/dL   Comment 1 Notify RN    Comment 2 Document in Chart   CBC  Result Value Ref Range   WBC 13.7 (H) 4.0 - 10.5 K/uL   RBC 3.74 (L) 3.87 - 5.11 MIL/uL   Hemoglobin 10.0 (L) 12.0 - 15.0 g/dL   HCT 16.1 (L) 09.6 - 04.5 %   MCV 83.4 78.0 - 100.0 fL   MCH 26.7 26.0 - 34.0 pg   MCHC 32.1 30.0 - 36.0 g/dL   RDW 40.9 81.1 - 91.4 %   Platelets 296 150 - 400 K/uL  Basic Metabolic Panel  Result Value Ref Range   Sodium 137 135 - 145 mmol/L   Potassium 3.8 3.5 - 5.1 mmol/L   Chloride 104 101 - 111 mmol/L   CO2 26 22 - 32 mmol/L   Glucose, Bld 147 (H) 65 - 99 mg/dL   BUN 9 6 - 20 mg/dL   Creatinine, Ser 7.82 0.44 - 1.00 mg/dL   Calcium 8.5 (L) 8.9 - 10.3 mg/dL   GFR calc non Af Amer >60 >60 mL/min   GFR calc Af Amer >60 >60 mL/min   Anion gap 7 5 - 15  Glucose, capillary  Result Value Ref Range   Glucose-Capillary 173 (H) 65 - 99 mg/dL   Comment 1 Notify RN    Comment 2 Document in Chart   Glucose, capillary  Result Value Ref Range   Glucose-Capillary 150 (H) 65 - 99 mg/dL    Discharge Medications:   Allergies as of 03/12/2017      Reactions   Codeine Nausea And Vomiting      Medication List    TAKE these medications   atorvastatin 20 MG tablet Commonly known as:  LIPITOR TAKE 40mg   BY MOUTH DAILY   BIOTIN PO Take 1 tablet by mouth daily.   gabapentin 100 MG capsule Commonly known as:  NEURONTIN Take 1 capsule (100 mg total) by mouth 3 (three) times daily.   HYDROcodone-acetaminophen 5-325 MG tablet Commonly known as:  NORCO/VICODIN Take 1 tablet by mouth every 6 (six) hours as needed for moderate pain. What changed:  Another medication with the same name was added. Make sure you understand how and when to take each.   HYDROcodone-acetaminophen 5-325 MG tablet Commonly known as:  NORCO/VICODIN Take 1-2 tablets by mouth every 6 (six) hours as needed for severe pain ((score 7 to 10)). What  changed:  You were already taking a medication with the same name, and this prescription was added. Make sure you understand how and when to take each.   LORazepam 1 MG tablet Commonly known as:  ATIVAN Take 1 mg by mouth 2 (two) times daily as needed for anxiety.   losartan 100 MG tablet Commonly known as:  COZAAR TAKE 100mg   BY MOUTH DAILY   metaxalone 800 MG tablet Commonly known as:  SKELAXIN Take 0.5 tablets (400 mg total) by  mouth 3 (three) times daily.   metFORMIN 500 MG tablet Commonly known as:  GLUCOPHAGE TAKE 500 mg  BY MOUTH twice daily   methocarbamol 500 MG tablet Commonly known as:  ROBAXIN Take 1 tablet (500 mg total) by mouth every 8 (eight) hours as needed for muscle spasms.   naproxen sodium 220 MG tablet Commonly known as:  ANAPROX Take 220 mg by mouth 2 (two) times daily as needed (general pain).   norethindrone-ethinyl estradiol 0.5-2.5 MG-MCG tablet Commonly known as:  FEMHRT LOW DOSE Take 1 tablet by mouth daily.   ondansetron 4 MG tablet Commonly known as:  ZOFRAN Take 1 tablet (4 mg total) by mouth every 6 (six) hours as needed for nausea or vomiting.   oxybutynin 5 MG 24 hr tablet Commonly known as:  DITROPAN-XL Take 5 mg by mouth.   oxyCODONE 10 mg 12 hr tablet Commonly known as:  OXYCONTIN Take 1 tablet (10 mg total) by mouth every 12 (twelve) hours.   PARoxetine 10 MG tablet Commonly known as:  PAXIL Take 10 mg by mouth daily.       Diagnostic Studies: Dg Cervical Spine 1 View  Result Date: 03/11/2017 CLINICAL DATA:  Cervical spine localization images for C6-C7 LEFT foraminotomy EXAM: DG C-ARM 61-120 MIN; DG CERVICAL SPINE - 1 VIEW COMPARISON:  Prior cervical spine MRI of 12/03/2016 FLUOROSCOPY TIME:  0 minutes 7 seconds Images obtained:  1 FINDINGS: Single cross-table lateral intraoperative image of the cervical spine was performed. An anterior plate and screws are identified at C5-C6. C6 is inadequately visualized due to  superimposition of the shoulders. A metallic probe projects dorsal to the cervical spine at approximately the level of the spinous process of C6. IMPRESSION: Metallic probe projects approximately dorsal to the spinous process of C6; note that C6 is inadequately visualized due to superimposition of the shoulders. Electronically Signed   By: Ulyses Southward M.D.   On: 03/11/2017 09:04   Dg C-arm 1-60 Min  Result Date: 03/11/2017 CLINICAL DATA:  Cervical spine localization images for C6-C7 LEFT foraminotomy EXAM: DG C-ARM 61-120 MIN; DG CERVICAL SPINE - 1 VIEW COMPARISON:  Prior cervical spine MRI of 12/03/2016 FLUOROSCOPY TIME:  0 minutes 7 seconds Images obtained:  1 FINDINGS: Single cross-table lateral intraoperative image of the cervical spine was performed. An anterior plate and screws are identified at C5-C6. C6 is inadequately visualized due to superimposition of the shoulders. A metallic probe projects dorsal to the cervical spine at approximately the level of the spinous process of C6. IMPRESSION: Metallic probe projects approximately dorsal to the spinous process of C6; note that C6 is inadequately visualized due to superimposition of the shoulders. Electronically Signed   By: Ulyses Southward M.D.   On: 03/11/2017 09:04    Disposition: 01-Home or Self Care  Discharge Instructions    Call MD / Call 911    Complete by:  As directed    If you experience chest pain or shortness of breath, CALL 911 and be transported to the hospital emergency room.  If you develope a fever above 101 F, pus (white drainage) or increased drainage or redness at the wound, or calf pain, call your surgeon's office.   Constipation Prevention    Complete by:  As directed    Drink plenty of fluids.  Prune juice may be helpful.  You may use a stool softener, such as Colace (over the counter) 100 mg twice a day.  Use MiraLax (over the counter) for constipation as  needed.   Diet - low sodium heart healthy    Complete by:  As  directed    Discharge instructions    Complete by:  As directed    No lifting greater than 10 lbs. Avoid bending, stooping and twisting. Walking in house for first week then may start to get out slowly increasing activity using arms. Keep incision dry for 3 days, may use tegaderm or similar water impervious dressing. Avoid overhead use of arms and overhead lifting. Wear collar for comfort. Use ice as needed for comfort.   Driving restrictions    Complete by:  As directed    No driving for 4 weeks   Increase activity slowly as tolerated    Complete by:  As directed    Lifting restrictions    Complete by:  As directed    No lifting for 6 weeks      Follow-up Information    Kerrin Champagne, MD In 1 week.   Specialty:  Orthopedic Surgery Why:  For wound re-check Contact information: [address] [place] [address] [phone]            Signed: Vira Browns 03/12/2017, 11:52 AM

## 2017-03-12 NOTE — Therapy (Signed)
Occupational Therapy Evaluation Patient Details Name: Ruth Martin MRN: [id number] DOB: [birth year] Today's Date: 03/12/2017    History of Present Illness s/p LEFT CERVICAL 6-7 FORAMINOTOMY. PMH DM; HTN GERD   Clinical Impression   Pt reports being independent in ADLs and works full time as a Counsellor. Currently, pt requires supervision/set up for all ADLs and functional mobility. Pt reports husband is available to provide 24 hour assistance as needed upon d/c home. Pt completed all acute OT education on compensatory techniques for ADLs and IADLs with cervical precautions. Pt is safe to d/c home with intermittent supervision. OT signing off.     Follow Up Recommendations  DC plan and follow up therapy as arranged by surgeon;Supervision - Intermittent    Equipment Recommendations  None recommended by OT    Recommendations for Other Services       Precautions / Restrictions Precautions Precautions: Cervical Precaution Comments: Reviewed precautions with pt and left handout with her Required Braces or Orthoses: Cervical Brace Cervical Brace: Soft collar Restrictions Weight Bearing Restrictions: No      Mobility Bed Mobility Overal bed mobility: Needs Assistance Bed Mobility: Rolling;Sit to Sidelying Rolling: Supervision Sidelying to sit: Supervision     Sit to sidelying: Supervision General bed mobility comments: supervision for safety and compliance with log roll.  Transfers Overall transfer level: Modified independent Equipment used: None Transfers: Sit to/from Stand Sit to Stand: Modified independent (Device/Increase time)         General transfer comment: no difficulty or concern here    Balance Overall balance assessment: Independent Sitting-balance support: No upper extremity supported;Feet supported Sitting balance-Leahy Scale: Good     Standing balance support: No upper extremity supported;During functional activity Standing balance-Leahy Scale:  Good                             ADL either performed or assessed with clinical judgement   ADL Overall ADL's : Needs assistance/impaired Eating/Feeding: Independent   Grooming: Supervision/safety;Set up;Standing   Upper Body Bathing: Supervision/ safety;Set up;Sitting Upper Body Bathing Details (indicate cue type and reason): Pt able to don cervical brace with supervision/set up Lower Body Bathing: Supervison/ safety;Set up;Sit to/from stand   Upper Body Dressing : Supervision/safety;Set up;Sitting   Lower Body Dressing: Supervision/safety;Set up;Sit to/from stand   Toilet Transfer: Supervision/safety;Set up;Ambulation Toilet Transfer Details (indicate cue type and reason): Simulated sit to stand from EOB        Tub/Shower Transfer Details (indicate cue type and reason): Pt educated on compensatory technique for showering with cervial precautions.  Functional mobility during ADLs: Supervision/safety General ADL Comments: pt educated on compensatory technqiues for ADLs with cervical precautions. Pt is able to bring feet over knees for LB ADLs. Pt educated on two cup technique for oral care.      Vision Baseline Vision/History: No visual deficits       Perception     Praxis      Pertinent Vitals/Pain Pain Assessment: No/denies pain     Hand Dominance Right   Extremity/Trunk Assessment Upper Extremity Assessment Upper Extremity Assessment: Defer to OT evaluation   Lower Extremity Assessment Lower Extremity Assessment: Overall WFL for tasks assessed (strength 5/5, sensation intact to LT)   Cervical / Trunk Assessment Cervical / Trunk Assessment: Other exceptions Cervical / Trunk Exceptions: s/p LEFT CERVICAL 6-7 FORAMINOTOMY   Communication Communication Communication: No difficulties   Cognition Arousal/Alertness: Awake/alert Behavior During Therapy: WFL for tasks assessed/performed Overall  Cognitive Status: Within Functional Limits for tasks  assessed                                     General Comments  Pt educated on cervical precautions.    Exercises     Shoulder Instructions      Home Living Family/patient expects to be discharged to:: Private residence Living Arrangements: Spouse/significant other Available Help at Discharge: Family;Available 24 hours/day Type of Home: House Home Access: Stairs to enter Entergy CorporationEntrance Stairs-Number of Steps: 2 Entrance Stairs-Rails: Right;Left Home Layout: One level     Bathroom Shower/Tub: IT trainerTub/shower unit;Curtain   Bathroom Toilet: Standard Bathroom Accessibility: Yes How Accessible: Accessible via walker Home Equipment: None          Prior Functioning/Environment Level of Independence: Independent        Comments: works as Advertising account executivenurse        OT Problem List: Decreased safety awareness;Decreased knowledge of use of DME or AE;Decreased knowledge of precautions      OT Treatment/Interventions: Self-care/ADL training;DME and/or AE instruction;Therapeutic activities;Patient/family education;Balance training    OT Goals(Current goals can be found in the care plan section) Acute Rehab OT Goals Patient Stated Goal: To go home  OT Goal Formulation: All assessment and education complete, DC therapy  OT Frequency:    Barriers to D/C:            Co-evaluation              AM-PAC PT "6 Clicks" Daily Activity     Outcome Measure Help from another person eating meals?: None Help from another person taking care of personal grooming?: None Help from another person toileting, which includes using toliet, bedpan, or urinal?: None Help from another person bathing (including washing, rinsing, drying)?: None Help from another person to put on and taking off regular upper body clothing?: None Help from another person to put on and taking off regular lower body clothing?: None 6 Click Score: 24   End of Session Equipment Utilized During Treatment: Gait  belt Nurse Communication: Mobility status  Activity Tolerance: Patient tolerated treatment well Patient left: in bed;with call bell/phone within reach;with family/visitor present  OT Visit Diagnosis: Other abnormalities of gait and mobility (R26.89)                Time: [id number] OT Time Calculation (min): 16 min Charges:  OT General Charges $OT Visit: 1 Visit OT Evaluation $OT Eval Low Complexity: 1 Low G-Codes: OT G-codes **NOT FOR INPATIENT CLASS** Functional Assessment Tool Used: Clinical judgement Functional Limitation: Self care Self Care Current Status (V4098(G8987): At least 1 percent but less than 20 percent impaired, limited or restricted Self Care Goal Status (J1914(G8988): At least 1 percent but less than 20 percent impaired, limited or restricted Self Care Discharge Status [phone](G8989): At least 1 percent but less than 20 percent impaired, limited or restricted   Cammy Copaourtney Mills, OTS [phone]  Pt seen under direct supervision of OT/L. Agree with assessment. No further OT needs.  [facility] Hospital[facility] Jamere Stidham, OT/L  [phone] 03/12/2017  Demarian Epps,HILLARY 03/12/2017, 10:56 AM

## 2017-03-12 NOTE — Progress Notes (Signed)
Pt given discharge instructions. Husband has belongings. Will follow up with Dr.Nitka in 1 week. Taken down via wheelchair

## 2017-03-12 NOTE — Care Management Note (Signed)
Case Management Note  Patient Details  Name: Ruth Martin MRN: [id number] Date of Birth: 10/26/1960  Subjective/Objective:   Pt s/p cervical foraminotomy. She is from home with her spouse.                  Action/Plan: No f/u per PT/OT and no DME needs. MD d/c summary states HH services. No recommendations and no HH orders. Pt has transportation home. No further needs per CM   Expected Discharge Date:  03/12/17               Expected Discharge Plan:  Home/Self Care  In-House Referral:     Discharge planning Services     Post Acute Care Choice:    Choice offered to:     DME Arranged:    DME Agency:     HH Arranged:    HH Agency:     Status of Service:  Completed, signed off  If discussed at [employer] Length of Stay Meetings, dates discussed:    Additional Comments:  Ruth BaloKelli F Attie Nawabi, RN 03/12/2017, 11:57 AM

## 2017-03-12 NOTE — Progress Notes (Signed)
Discharge instructions reviewed with patient/family. All questions answered at this time. RXs given. Transport home by family.  Nicolle Heward, RN 

## 2017-03-13 ENCOUNTER — Other Ambulatory Visit (INDEPENDENT_AMBULATORY_CARE_PROVIDER_SITE_OTHER): Payer: Self-pay | Admitting: Specialist

## 2017-03-13 ENCOUNTER — Telehealth (INDEPENDENT_AMBULATORY_CARE_PROVIDER_SITE_OTHER): Payer: Self-pay | Admitting: Radiology

## 2017-03-13 MED ORDER — TIZANIDINE HCL 2 MG PO TABS: 2.0000 mg | ORAL_TABLET | Freq: Four times a day (QID) | ORAL | 0 refills | Status: AC | PRN

## 2017-03-13 MED FILL — Thrombin (Recombinant) For Soln 5000 Unit: CUTANEOUS | Qty: 5000 | Status: AC

## 2017-03-13 NOTE — Telephone Encounter (Signed)
2sched for 03/21/17 @ 2pm

## 2017-03-13 NOTE — Telephone Encounter (Signed)
-----   Message from [name], MD sent at 03/12/2017  7:46 AM EDT ----- Regarding: Please schedule an appt with this patient to be seen in one week. Please schedule an appt with this patient to be seen in one week.Ruth Martin

## 2017-03-20 ENCOUNTER — Inpatient Hospital Stay (INDEPENDENT_AMBULATORY_CARE_PROVIDER_SITE_OTHER): Payer: BLUE CROSS/BLUE SHIELD | Admitting: Specialist

## 2017-03-21 ENCOUNTER — Encounter (INDEPENDENT_AMBULATORY_CARE_PROVIDER_SITE_OTHER): Payer: Self-pay | Admitting: Specialist

## 2017-03-21 ENCOUNTER — Ambulatory Visit (INDEPENDENT_AMBULATORY_CARE_PROVIDER_SITE_OTHER): Payer: BLUE CROSS/BLUE SHIELD | Admitting: Specialist

## 2017-03-21 ENCOUNTER — Telehealth (INDEPENDENT_AMBULATORY_CARE_PROVIDER_SITE_OTHER): Payer: Self-pay | Admitting: Specialist

## 2017-03-21 VITALS — BP 116/79 | HR 99 | Ht 67.0 in | Wt 204.0 lb

## 2017-03-21 DIAGNOSIS — M4722 Other spondylosis with radiculopathy, cervical region: Secondary | ICD-10-CM

## 2017-03-21 MED ORDER — METHOCARBAMOL 500 MG PO TABS: 500.0000 mg | ORAL_TABLET | Freq: Three times a day (TID) | ORAL | 1 refills | Status: AC | PRN

## 2017-03-21 NOTE — Telephone Encounter (Signed)
Patient called advised she did not get a Rx for Zpack. The number to contact patient is [phone]

## 2017-03-21 NOTE — Patient Instructions (Signed)
Plan: Avoid overhead lifting and overhead use of the arms. Do not lift greater than 5 lbs. Adjust head rest in vehicle to prevent hyperextension if rear ended. Take extra precautions to avoid falling. May shower and wet the incision. Collar for 1/2 days. Creel oil and Vitamin B complex.

## 2017-03-21 NOTE — Telephone Encounter (Signed)
Patient called advised she did not get a Rx for Zpack. She also wants to know when she can drive again.  Please advise.

## 2017-03-21 NOTE — Progress Notes (Signed)
   Post-Op Visit Note   Patient: Ruth BullionDeborah J Brunsman           Date of Birth: 05/20/1961           MRN: [id number] Visit Date: 03/21/2017 PCP: Lise AuerKhan, Jaber A, MD   Assessment & Plan:2 week post left C6-7 foramenotomy for spondylosis.   Chief Complaint:  Chief Complaint  Patient presents with  . Neck - Routine Post Op  Incision is healed. Motor with 4+/5 left intrinsic weakness, biceps and tricep. Numbness into the left Index and thumb.   Visit Diagnoses:  1. Other spondylosis with radiculopathy, cervical region     Plan: Avoid overhead lifting and overhead use of the arms. Do not lift greater than 5 lbs. Adjust head rest in vehicle to prevent hyperextension if rear ended. Take extra precautions to avoid falling. May shower and wet the incision. Collar for 1/2 days.  Follow-Up Instructions: Return in about 3 weeks (around 04/11/2017).   Orders:  No orders of the defined types were placed in this encounter.  No orders of the defined types were placed in this encounter.   Imaging: No results found.  PMFS History: Patient Active Problem List   Diagnosis Date Noted  . Herniation of cervical intervertebral disc with radiculopathy 03/11/2017    Priority: High    Class: Acute  . CSF leak 03/12/2017  . Cervical spondylosis with radiculopathy 03/11/2017   Past Medical History:  Diagnosis Date  . Diabetes mellitus without complication (HCC)   . GERD (gastroesophageal reflux disease)   . HNP (herniated nucleus pulposus), cervical    6-7  . Hypercholesterolemia   . Hypertension   . PONV (postoperative nausea and vomiting)   . Wears glasses     Family History  Problem Relation Age of Onset  . Restless legs syndrome Mother   . Hypertension Father   . Diabetes Father   . Hypercholesterolemia Father   . Hypertension Brother   . Diabetes Brother   . Hypercholesterolemia Brother     Past Surgical History:  Procedure Laterality Date  . CARPAL TUNNEL RELEASE    .  CERVICAL FUSION    . COLONOSCOPY W/ BIOPSIES AND POLYPECTOMY    . FRACTURE SURGERY     left foot  . POSTERIOR CERVICAL FUSION/FORAMINOTOMY Left 03/11/2017   Procedure: LEFT CERVICAL 6-7 FORAMINOTOMY;  Surgeon: Kerrin ChampagneNitka, Taisley Mordan E, MD;  Location: [facility] OR;  Service: Orthopedics;  Laterality: Left;  . TUBAL LIGATION     Social History   Occupational History  . Not on file.   Social History Main Topics  . Smoking status: Former Smoker    Types: Cigarettes  . Smokeless tobacco: Never Used     Comment: Quit smoking cigarettes in 2000  . Alcohol use No  . Drug use: No  . Sexual activity: Not on file

## 2017-03-27 DIAGNOSIS — M791 Myalgia, unspecified site: Secondary | ICD-10-CM | POA: Diagnosis not present

## 2017-04-02 ENCOUNTER — Other Ambulatory Visit (INDEPENDENT_AMBULATORY_CARE_PROVIDER_SITE_OTHER): Payer: Self-pay | Admitting: Specialist

## 2017-04-02 NOTE — Telephone Encounter (Signed)
Gabapentin refill request 

## 2017-04-18 ENCOUNTER — Ambulatory Visit (INDEPENDENT_AMBULATORY_CARE_PROVIDER_SITE_OTHER): Payer: BLUE CROSS/BLUE SHIELD | Admitting: Specialist

## 2017-04-18 ENCOUNTER — Encounter (INDEPENDENT_AMBULATORY_CARE_PROVIDER_SITE_OTHER): Payer: Self-pay | Admitting: Specialist

## 2017-04-18 VITALS — BP 138/90 | HR 78 | Ht 67.0 in | Wt 204.0 lb

## 2017-04-18 DIAGNOSIS — Z9889 Other specified postprocedural states: Secondary | ICD-10-CM

## 2017-04-18 NOTE — Patient Instructions (Signed)
Plan: Avoid overhead lifting and overhead use of the arms. Do not lift greater than 5 lbs. Adjust head rest in vehicle to prevent hyperextension if rear ended. Take extra precautions to avoid falling. 

## 2017-04-18 NOTE — Progress Notes (Signed)
   Post-Op Visit Note   Patient: Ruth BullionDeborah J Martin           Date of Birth: 12/01/1960           MRN: [id number] Visit Date: 04/18/2017 PCP: Lise AuerKhan, Jaber A, MD   Assessment & Plan: 5 weeks post left C6-7 foramenotomy.  Chief Complaint:  Chief Complaint  Patient presents with  . Neck - Routine Post Op, Follow-up  Incision is healed and no swelling or fluctuance. Motor is normal  Some numbness tips of the left thumb, index and long finger.  Visit Diagnoses: No diagnosis found.  PlanAvoid overhead lifting and overhead use of the arms. Do not lift greater than 5 lbs. Adjust head rest in vehicle to prevent hyperextension if rear ended. Take extra precautions to avoid falling.   Follow-Up Instructions: No Follow-up on file.   Orders:  No orders of the defined types were placed in this encounter.  No orders of the defined types were placed in this encounter.   Imaging: No results found.  PMFS History: Patient Active Problem List   Diagnosis Date Noted  . Herniation of cervical intervertebral disc with radiculopathy 03/11/2017    Priority: High    Class: Acute  . CSF leak 03/12/2017  . Cervical spondylosis with radiculopathy 03/11/2017   Past Medical History:  Diagnosis Date  . Diabetes mellitus without complication (HCC)   . GERD (gastroesophageal reflux disease)   . HNP (herniated nucleus pulposus), cervical    6-7  . Hypercholesterolemia   . Hypertension   . PONV (postoperative nausea and vomiting)   . Wears glasses     Family History  Problem Relation Age of Onset  . Restless legs syndrome Mother   . Hypertension Father   . Diabetes Father   . Hypercholesterolemia Father   . Hypertension Brother   . Diabetes Brother   . Hypercholesterolemia Brother     Past Surgical History:  Procedure Laterality Date  . CARPAL TUNNEL RELEASE    . CERVICAL FUSION    . COLONOSCOPY W/ BIOPSIES AND POLYPECTOMY    . FRACTURE SURGERY     left foot  . POSTERIOR CERVICAL  FUSION/FORAMINOTOMY Left 03/11/2017   Procedure: LEFT CERVICAL 6-7 FORAMINOTOMY;  Surgeon: Kerrin ChampagneNitka, Karlee Staff E, MD;  Location: [facility] OR;  Service: Orthopedics;  Laterality: Left;  . TUBAL LIGATION     Social History   Occupational History  . Not on file  Tobacco Use  . Smoking status: Former Smoker    Types: Cigarettes  . Smokeless tobacco: Never Used  . Tobacco comment: Quit smoking cigarettes in 2000  Substance and Sexual Activity  . Alcohol use: No  . Drug use: No  . Sexual activity: Not on file

## 2017-04-26 DIAGNOSIS — M791 Myalgia, unspecified site: Secondary | ICD-10-CM | POA: Diagnosis not present

## 2017-05-09 DIAGNOSIS — Z6829 Body mass index (BMI) 29.0-29.9, adult: Secondary | ICD-10-CM | POA: Diagnosis not present

## 2017-05-09 DIAGNOSIS — S99921A Unspecified injury of right foot, initial encounter: Secondary | ICD-10-CM | POA: Diagnosis not present

## 2017-05-30 ENCOUNTER — Encounter (INDEPENDENT_AMBULATORY_CARE_PROVIDER_SITE_OTHER): Payer: Self-pay | Admitting: Specialist

## 2017-05-30 ENCOUNTER — Ambulatory Visit (INDEPENDENT_AMBULATORY_CARE_PROVIDER_SITE_OTHER): Payer: BLUE CROSS/BLUE SHIELD | Admitting: Specialist

## 2017-05-30 VITALS — BP 128/78 | HR 73 | Ht 67.0 in | Wt 204.0 lb

## 2017-05-30 DIAGNOSIS — M5412 Radiculopathy, cervical region: Secondary | ICD-10-CM

## 2017-05-30 NOTE — Patient Instructions (Signed)
Occasional overhead lifting and overhead use of the arms. Do not lift greater than 25 lbs. Adjust head rest in vehicle to prevent hyperextension if rear ended. Take extra precautions to avoid falling.

## 2017-05-30 NOTE — Progress Notes (Signed)
   Post-Op Visit Note   Patient: Ruth Martin           Date of Birth: 04/21/1961           MRN: [id number] Visit Date: 05/30/2017 PCP: Lise AuerKhan, Jaber A, MD   Assessment & Plan: 2.5 months post left cervical foramenotomy  Chief Complaint:  Chief Complaint  Patient presents with  . Neck - Routine Post Op  The left arm is improved and only minimal paresthesias left index and long finger. Motor normal left arm.    Visit Diagnoses: No diagnosis found.  Plan: Occasional overhead lifting and overhead use of the arms. Do not lift greater than 25 lbs. Adjust head rest in vehicle to prevent hyperextension if rear ended. Take extra precautions to avoid falling.   Follow-Up Instructions: No Follow-up on file.   Orders:  No orders of the defined types were placed in this encounter.  No orders of the defined types were placed in this encounter.   Imaging: No results found.  PMFS History: Patient Active Problem List   Diagnosis Date Noted  . Herniation of cervical intervertebral disc with radiculopathy 03/11/2017    Priority: High    Class: Acute  . CSF leak 03/12/2017  . Cervical spondylosis with radiculopathy 03/11/2017   Past Medical History:  Diagnosis Date  . Diabetes mellitus without complication (HCC)   . GERD (gastroesophageal reflux disease)   . HNP (herniated nucleus pulposus), cervical    6-7  . Hypercholesterolemia   . Hypertension   . PONV (postoperative nausea and vomiting)   . Wears glasses     Family History  Problem Relation Age of Onset  . Restless legs syndrome Mother   . Hypertension Father   . Diabetes Father   . Hypercholesterolemia Father   . Hypertension Brother   . Diabetes Brother   . Hypercholesterolemia Brother     Past Surgical History:  Procedure Laterality Date  . CARPAL TUNNEL RELEASE    . CERVICAL FUSION    . COLONOSCOPY W/ BIOPSIES AND POLYPECTOMY    . FRACTURE SURGERY     left foot  . POSTERIOR CERVICAL  FUSION/FORAMINOTOMY Left 03/11/2017   Procedure: LEFT CERVICAL 6-7 FORAMINOTOMY;  Surgeon: Kerrin ChampagneNitka, Delford Wingert E, MD;  Location: [facility] OR;  Service: Orthopedics;  Laterality: Left;  . TUBAL LIGATION     Social History   Occupational History  . Not on file  Tobacco Use  . Smoking status: Former Smoker    Types: Cigarettes  . Smokeless tobacco: Never Used  . Tobacco comment: Quit smoking cigarettes in 2000  Substance and Sexual Activity  . Alcohol use: No  . Drug use: No  . Sexual activity: Not on file

## 2017-07-31 DIAGNOSIS — S99922A Unspecified injury of left foot, initial encounter: Secondary | ICD-10-CM | POA: Diagnosis not present

## 2017-08-13 DIAGNOSIS — R635 Abnormal weight gain: Secondary | ICD-10-CM | POA: Diagnosis not present

## 2017-08-13 DIAGNOSIS — N951 Menopausal and female climacteric states: Secondary | ICD-10-CM | POA: Diagnosis not present

## 2017-08-14 DIAGNOSIS — N951 Menopausal and female climacteric states: Secondary | ICD-10-CM | POA: Diagnosis not present

## 2017-08-14 DIAGNOSIS — Z1339 Encounter for screening examination for other mental health and behavioral disorders: Secondary | ICD-10-CM | POA: Diagnosis not present

## 2017-08-14 DIAGNOSIS — R232 Flushing: Secondary | ICD-10-CM | POA: Diagnosis not present

## 2017-08-14 DIAGNOSIS — E669 Obesity, unspecified: Secondary | ICD-10-CM | POA: Diagnosis not present

## 2017-08-14 DIAGNOSIS — E119 Type 2 diabetes mellitus without complications: Secondary | ICD-10-CM | POA: Diagnosis not present

## 2017-08-14 DIAGNOSIS — I10 Essential (primary) hypertension: Secondary | ICD-10-CM | POA: Diagnosis not present

## 2017-08-14 DIAGNOSIS — Z1331 Encounter for screening for depression: Secondary | ICD-10-CM | POA: Diagnosis not present

## 2017-08-23 DIAGNOSIS — E7800 Pure hypercholesterolemia, unspecified: Secondary | ICD-10-CM | POA: Diagnosis not present

## 2017-08-23 DIAGNOSIS — E119 Type 2 diabetes mellitus without complications: Secondary | ICD-10-CM | POA: Diagnosis not present

## 2017-08-30 DIAGNOSIS — E119 Type 2 diabetes mellitus without complications: Secondary | ICD-10-CM | POA: Diagnosis not present

## 2017-08-30 DIAGNOSIS — I10 Essential (primary) hypertension: Secondary | ICD-10-CM | POA: Diagnosis not present

## 2017-09-06 DIAGNOSIS — E119 Type 2 diabetes mellitus without complications: Secondary | ICD-10-CM | POA: Diagnosis not present

## 2017-09-06 DIAGNOSIS — I10 Essential (primary) hypertension: Secondary | ICD-10-CM | POA: Diagnosis not present

## 2017-09-12 DIAGNOSIS — E119 Type 2 diabetes mellitus without complications: Secondary | ICD-10-CM | POA: Diagnosis not present

## 2017-09-12 DIAGNOSIS — I10 Essential (primary) hypertension: Secondary | ICD-10-CM | POA: Diagnosis not present

## 2017-09-12 DIAGNOSIS — E7800 Pure hypercholesterolemia, unspecified: Secondary | ICD-10-CM | POA: Diagnosis not present

## 2017-09-19 DIAGNOSIS — E119 Type 2 diabetes mellitus without complications: Secondary | ICD-10-CM | POA: Diagnosis not present

## 2017-09-19 DIAGNOSIS — I10 Essential (primary) hypertension: Secondary | ICD-10-CM | POA: Diagnosis not present

## 2017-09-27 DIAGNOSIS — I10 Essential (primary) hypertension: Secondary | ICD-10-CM | POA: Diagnosis not present

## 2017-09-27 DIAGNOSIS — E7800 Pure hypercholesterolemia, unspecified: Secondary | ICD-10-CM | POA: Diagnosis not present

## 2017-10-03 DIAGNOSIS — I10 Essential (primary) hypertension: Secondary | ICD-10-CM | POA: Diagnosis not present

## 2017-10-03 DIAGNOSIS — E119 Type 2 diabetes mellitus without complications: Secondary | ICD-10-CM | POA: Diagnosis not present

## 2017-10-10 DIAGNOSIS — E7800 Pure hypercholesterolemia, unspecified: Secondary | ICD-10-CM | POA: Diagnosis not present

## 2017-10-10 DIAGNOSIS — I10 Essential (primary) hypertension: Secondary | ICD-10-CM | POA: Diagnosis not present

## 2017-10-10 DIAGNOSIS — E119 Type 2 diabetes mellitus without complications: Secondary | ICD-10-CM | POA: Diagnosis not present

## 2017-10-25 DIAGNOSIS — I10 Essential (primary) hypertension: Secondary | ICD-10-CM | POA: Diagnosis not present

## 2017-11-01 DIAGNOSIS — I10 Essential (primary) hypertension: Secondary | ICD-10-CM | POA: Diagnosis not present

## 2017-11-08 DIAGNOSIS — E663 Overweight: Secondary | ICD-10-CM | POA: Diagnosis not present

## 2017-11-08 DIAGNOSIS — E119 Type 2 diabetes mellitus without complications: Secondary | ICD-10-CM | POA: Diagnosis not present

## 2017-11-08 DIAGNOSIS — E669 Obesity, unspecified: Secondary | ICD-10-CM | POA: Diagnosis not present

## 2017-11-08 DIAGNOSIS — I10 Essential (primary) hypertension: Secondary | ICD-10-CM | POA: Diagnosis not present

## 2017-11-08 DIAGNOSIS — E7800 Pure hypercholesterolemia, unspecified: Secondary | ICD-10-CM | POA: Diagnosis not present

## 2017-11-13 DIAGNOSIS — I10 Essential (primary) hypertension: Secondary | ICD-10-CM | POA: Diagnosis not present

## 2017-12-13 DIAGNOSIS — I10 Essential (primary) hypertension: Secondary | ICD-10-CM | POA: Diagnosis not present

## 2017-12-27 DIAGNOSIS — R300 Dysuria: Secondary | ICD-10-CM | POA: Diagnosis not present

## 2017-12-27 DIAGNOSIS — Z6826 Body mass index (BMI) 26.0-26.9, adult: Secondary | ICD-10-CM | POA: Diagnosis not present

## 2018-02-10 DIAGNOSIS — N951 Menopausal and female climacteric states: Secondary | ICD-10-CM | POA: Diagnosis not present

## 2018-02-10 DIAGNOSIS — Z01419 Encounter for gynecological examination (general) (routine) without abnormal findings: Secondary | ICD-10-CM | POA: Diagnosis not present

## 2018-02-10 DIAGNOSIS — Z1231 Encounter for screening mammogram for malignant neoplasm of breast: Secondary | ICD-10-CM | POA: Diagnosis not present

## 2018-02-13 DIAGNOSIS — Z1231 Encounter for screening mammogram for malignant neoplasm of breast: Secondary | ICD-10-CM | POA: Diagnosis not present

## 2018-02-19 DIAGNOSIS — Z23 Encounter for immunization: Secondary | ICD-10-CM | POA: Diagnosis not present

## 2018-03-07 DIAGNOSIS — E1165 Type 2 diabetes mellitus with hyperglycemia: Secondary | ICD-10-CM | POA: Diagnosis not present

## 2018-03-07 DIAGNOSIS — I10 Essential (primary) hypertension: Secondary | ICD-10-CM | POA: Diagnosis not present

## 2018-03-07 DIAGNOSIS — F5081 Binge eating disorder: Secondary | ICD-10-CM | POA: Diagnosis not present

## 2018-03-07 DIAGNOSIS — Z6827 Body mass index (BMI) 27.0-27.9, adult: Secondary | ICD-10-CM | POA: Diagnosis not present

## 2018-03-17 DIAGNOSIS — E119 Type 2 diabetes mellitus without complications: Secondary | ICD-10-CM | POA: Diagnosis not present

## 2018-03-25 DIAGNOSIS — Z23 Encounter for immunization: Secondary | ICD-10-CM | POA: Diagnosis not present

## 2018-07-03 DIAGNOSIS — Z780 Asymptomatic menopausal state: Secondary | ICD-10-CM | POA: Diagnosis not present

## 2018-07-04 DIAGNOSIS — M542 Cervicalgia: Secondary | ICD-10-CM | POA: Diagnosis not present

## 2018-07-04 DIAGNOSIS — S0990XA Unspecified injury of head, initial encounter: Secondary | ICD-10-CM | POA: Diagnosis not present

## 2018-07-04 DIAGNOSIS — M545 Low back pain: Secondary | ICD-10-CM | POA: Diagnosis not present

## 2018-07-04 DIAGNOSIS — S3992XA Unspecified injury of lower back, initial encounter: Secondary | ICD-10-CM | POA: Diagnosis not present

## 2018-07-04 DIAGNOSIS — S32020A Wedge compression fracture of second lumbar vertebra, initial encounter for closed fracture: Secondary | ICD-10-CM | POA: Diagnosis not present

## 2018-07-04 DIAGNOSIS — R51 Headache: Secondary | ICD-10-CM | POA: Diagnosis not present

## 2018-07-04 DIAGNOSIS — S199XXA Unspecified injury of neck, initial encounter: Secondary | ICD-10-CM | POA: Diagnosis not present

## 2018-07-04 DIAGNOSIS — S161XXA Strain of muscle, fascia and tendon at neck level, initial encounter: Secondary | ICD-10-CM | POA: Diagnosis not present

## 2018-07-04 IMAGING — RF DG CERVICAL SPINE 1V
1 series · 1 of 1 positions shown · non-contrast
Comparison: Prior cervical spine MRI of 12/03/2016

FLUOROSCOPY TIME:  0 minutes 7 seconds

Images obtained:  1

CLINICAL DATA: Cervical spine localization images for C6-C7 LEFT
foraminotomy

EXAM:
DG C-ARM 61-120 MIN; DG CERVICAL SPINE - 1 VIEW

[Series 1: run · 1 of 1 slices shown]
[im 1/1]
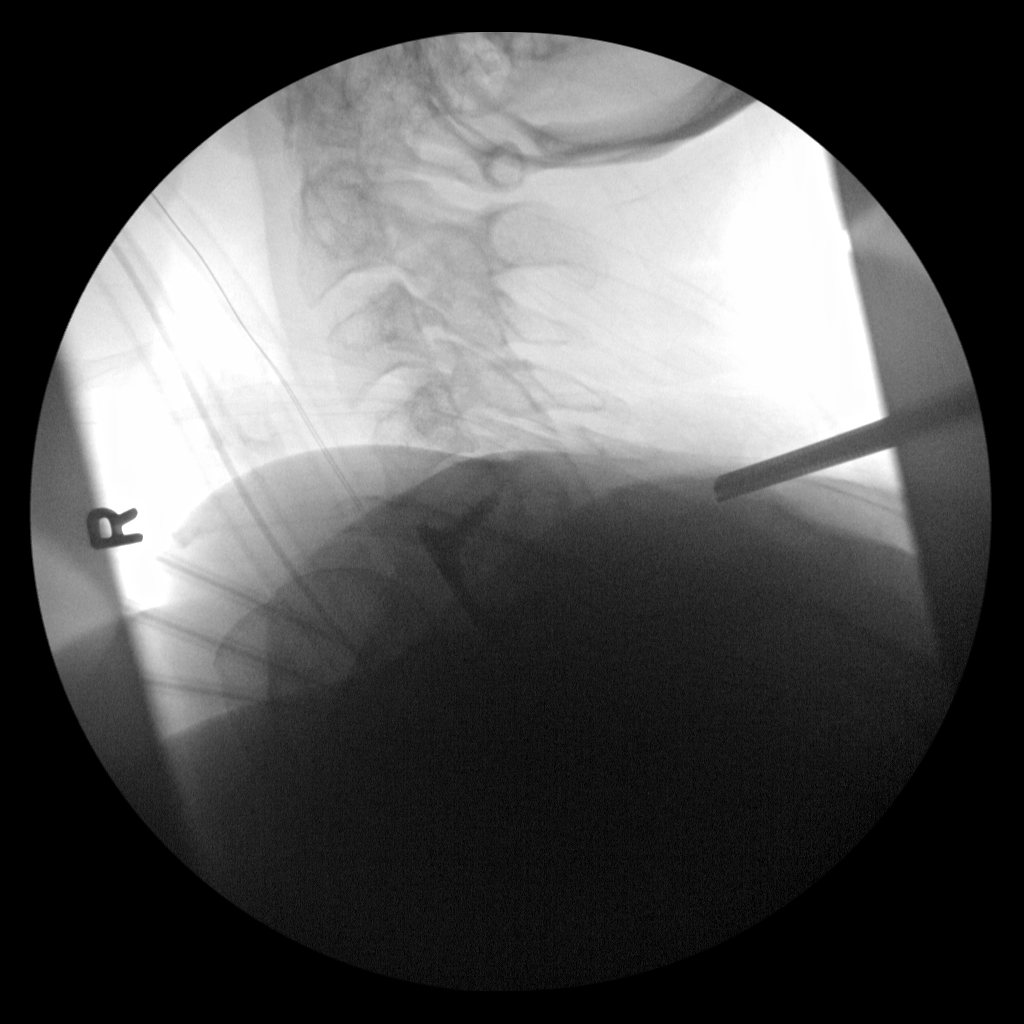

[1 of 1 positions shown; findings below may reference images not displayed]

FINDINGS: Single cross-table lateral intraoperative image of the cervical
spine was performed.

An anterior plate and screws are identified at C5-C6.

C6 is inadequately visualized due to superimposition of the
shoulders.

A metallic probe projects dorsal to the cervical spine at
approximately the level of the spinous process of C6.
IMPRESSION: Metallic probe projects approximately dorsal to the spinous process
of C6; note that C6 is inadequately visualized due to
superimposition of the shoulders.

## 2018-07-09 DIAGNOSIS — M7918 Myalgia, other site: Secondary | ICD-10-CM | POA: Diagnosis not present

## 2018-07-09 DIAGNOSIS — M545 Low back pain: Secondary | ICD-10-CM | POA: Diagnosis not present

## 2018-07-09 DIAGNOSIS — M542 Cervicalgia: Secondary | ICD-10-CM | POA: Diagnosis not present

## 2018-07-09 DIAGNOSIS — M47817 Spondylosis without myelopathy or radiculopathy, lumbosacral region: Secondary | ICD-10-CM | POA: Diagnosis not present

## 2018-08-27 DIAGNOSIS — E1169 Type 2 diabetes mellitus with other specified complication: Secondary | ICD-10-CM | POA: Diagnosis not present

## 2018-08-27 DIAGNOSIS — E785 Hyperlipidemia, unspecified: Secondary | ICD-10-CM | POA: Diagnosis not present

## 2018-08-27 DIAGNOSIS — K219 Gastro-esophageal reflux disease without esophagitis: Secondary | ICD-10-CM | POA: Diagnosis not present

## 2018-08-27 DIAGNOSIS — G4700 Insomnia, unspecified: Secondary | ICD-10-CM | POA: Diagnosis not present

## 2018-10-09 DIAGNOSIS — E669 Obesity, unspecified: Secondary | ICD-10-CM | POA: Diagnosis not present

## 2018-10-09 DIAGNOSIS — Z6831 Body mass index (BMI) 31.0-31.9, adult: Secondary | ICD-10-CM | POA: Diagnosis not present

## 2018-10-09 DIAGNOSIS — I10 Essential (primary) hypertension: Secondary | ICD-10-CM | POA: Diagnosis not present

## 2018-10-09 DIAGNOSIS — F5081 Binge eating disorder: Secondary | ICD-10-CM | POA: Diagnosis not present

## 2018-11-04 DIAGNOSIS — M25562 Pain in left knee: Secondary | ICD-10-CM | POA: Diagnosis not present

## 2018-11-10 DIAGNOSIS — M25562 Pain in left knee: Secondary | ICD-10-CM | POA: Diagnosis not present

## 2018-11-10 DIAGNOSIS — M2392 Unspecified internal derangement of left knee: Secondary | ICD-10-CM | POA: Diagnosis not present

## 2018-11-26 DIAGNOSIS — Z20828 Contact with and (suspected) exposure to other viral communicable diseases: Secondary | ICD-10-CM | POA: Diagnosis not present

## 2019-01-23 DIAGNOSIS — R1011 Right upper quadrant pain: Secondary | ICD-10-CM | POA: Diagnosis not present

## 2019-01-23 DIAGNOSIS — R197 Diarrhea, unspecified: Secondary | ICD-10-CM | POA: Diagnosis not present

## 2019-03-02 DIAGNOSIS — Z23 Encounter for immunization: Secondary | ICD-10-CM | POA: Diagnosis not present

## 2019-04-20 DIAGNOSIS — E119 Type 2 diabetes mellitus without complications: Secondary | ICD-10-CM | POA: Diagnosis not present

## 2019-04-20 DIAGNOSIS — R790 Abnormal level of blood mineral: Secondary | ICD-10-CM | POA: Diagnosis not present

## 2019-04-20 DIAGNOSIS — R7989 Other specified abnormal findings of blood chemistry: Secondary | ICD-10-CM | POA: Diagnosis not present

## 2019-04-20 DIAGNOSIS — N951 Menopausal and female climacteric states: Secondary | ICD-10-CM | POA: Diagnosis not present

## 2019-04-20 DIAGNOSIS — R635 Abnormal weight gain: Secondary | ICD-10-CM | POA: Diagnosis not present

## 2019-04-20 DIAGNOSIS — I10 Essential (primary) hypertension: Secondary | ICD-10-CM | POA: Diagnosis not present

## 2019-04-22 DIAGNOSIS — I10 Essential (primary) hypertension: Secondary | ICD-10-CM | POA: Diagnosis not present

## 2019-04-22 DIAGNOSIS — E119 Type 2 diabetes mellitus without complications: Secondary | ICD-10-CM | POA: Diagnosis not present

## 2019-04-22 DIAGNOSIS — Z1339 Encounter for screening examination for other mental health and behavioral disorders: Secondary | ICD-10-CM | POA: Diagnosis not present

## 2019-04-22 DIAGNOSIS — Z1331 Encounter for screening for depression: Secondary | ICD-10-CM | POA: Diagnosis not present

## 2019-04-22 DIAGNOSIS — Z6833 Body mass index (BMI) 33.0-33.9, adult: Secondary | ICD-10-CM | POA: Diagnosis not present

## 2019-04-22 DIAGNOSIS — E7800 Pure hypercholesterolemia, unspecified: Secondary | ICD-10-CM | POA: Diagnosis not present

## 2019-05-08 DIAGNOSIS — E7800 Pure hypercholesterolemia, unspecified: Secondary | ICD-10-CM | POA: Diagnosis not present

## 2019-05-08 DIAGNOSIS — Z6832 Body mass index (BMI) 32.0-32.9, adult: Secondary | ICD-10-CM | POA: Diagnosis not present

## 2019-05-18 DIAGNOSIS — L821 Other seborrheic keratosis: Secondary | ICD-10-CM | POA: Diagnosis not present

## 2019-05-18 DIAGNOSIS — L918 Other hypertrophic disorders of the skin: Secondary | ICD-10-CM | POA: Diagnosis not present

## 2019-05-18 DIAGNOSIS — C44519 Basal cell carcinoma of skin of other part of trunk: Secondary | ICD-10-CM | POA: Diagnosis not present

## 2019-05-18 DIAGNOSIS — L578 Other skin changes due to chronic exposure to nonionizing radiation: Secondary | ICD-10-CM | POA: Diagnosis not present

## 2019-05-21 DIAGNOSIS — Z6832 Body mass index (BMI) 32.0-32.9, adult: Secondary | ICD-10-CM | POA: Diagnosis not present

## 2019-05-21 DIAGNOSIS — E119 Type 2 diabetes mellitus without complications: Secondary | ICD-10-CM | POA: Diagnosis not present

## 2019-06-05 DIAGNOSIS — Z6831 Body mass index (BMI) 31.0-31.9, adult: Secondary | ICD-10-CM | POA: Diagnosis not present

## 2019-06-05 DIAGNOSIS — E7800 Pure hypercholesterolemia, unspecified: Secondary | ICD-10-CM | POA: Diagnosis not present

## 2019-06-18 DIAGNOSIS — C44519 Basal cell carcinoma of skin of other part of trunk: Secondary | ICD-10-CM | POA: Diagnosis not present

## 2019-07-01 DIAGNOSIS — Z01419 Encounter for gynecological examination (general) (routine) without abnormal findings: Secondary | ICD-10-CM | POA: Diagnosis not present

## 2019-07-01 DIAGNOSIS — Z1231 Encounter for screening mammogram for malignant neoplasm of breast: Secondary | ICD-10-CM | POA: Diagnosis not present

## 2019-07-03 DIAGNOSIS — Z1231 Encounter for screening mammogram for malignant neoplasm of breast: Secondary | ICD-10-CM | POA: Diagnosis not present

## 2019-08-10 DIAGNOSIS — E118 Type 2 diabetes mellitus with unspecified complications: Secondary | ICD-10-CM | POA: Diagnosis not present

## 2019-08-13 ENCOUNTER — Encounter: Payer: Self-pay | Admitting: Gastroenterology

## 2019-08-25 DIAGNOSIS — Z0184 Encounter for antibody response examination: Secondary | ICD-10-CM | POA: Diagnosis not present

## 2019-09-15 ENCOUNTER — Ambulatory Visit: Payer: BLUE CROSS/BLUE SHIELD | Admitting: Gastroenterology

## 2019-10-08 DIAGNOSIS — Z6831 Body mass index (BMI) 31.0-31.9, adult: Secondary | ICD-10-CM | POA: Diagnosis not present

## 2019-10-08 DIAGNOSIS — M7021 Olecranon bursitis, right elbow: Secondary | ICD-10-CM | POA: Diagnosis not present

## 2019-10-09 ENCOUNTER — Ambulatory Visit: Payer: Self-pay | Admitting: Gastroenterology

## 2019-12-02 DIAGNOSIS — E1169 Type 2 diabetes mellitus with other specified complication: Secondary | ICD-10-CM | POA: Diagnosis not present

## 2019-12-02 DIAGNOSIS — I10 Essential (primary) hypertension: Secondary | ICD-10-CM | POA: Diagnosis not present

## 2019-12-02 DIAGNOSIS — E782 Mixed hyperlipidemia: Secondary | ICD-10-CM | POA: Diagnosis not present

## 2019-12-02 DIAGNOSIS — E669 Obesity, unspecified: Secondary | ICD-10-CM | POA: Diagnosis not present

## 2019-12-22 DIAGNOSIS — M25531 Pain in right wrist: Secondary | ICD-10-CM | POA: Diagnosis not present

## 2019-12-22 DIAGNOSIS — Z6831 Body mass index (BMI) 31.0-31.9, adult: Secondary | ICD-10-CM | POA: Diagnosis not present

## 2020-03-22 DIAGNOSIS — Z23 Encounter for immunization: Secondary | ICD-10-CM | POA: Diagnosis not present

## 2020-05-12 DIAGNOSIS — D72829 Elevated white blood cell count, unspecified: Secondary | ICD-10-CM | POA: Diagnosis not present

## 2020-05-12 DIAGNOSIS — R5381 Other malaise: Secondary | ICD-10-CM | POA: Diagnosis not present

## 2020-05-12 DIAGNOSIS — R002 Palpitations: Secondary | ICD-10-CM | POA: Diagnosis not present

## 2020-05-12 DIAGNOSIS — Z20828 Contact with and (suspected) exposure to other viral communicable diseases: Secondary | ICD-10-CM | POA: Diagnosis not present

## 2020-05-12 DIAGNOSIS — R053 Chronic cough: Secondary | ICD-10-CM | POA: Diagnosis not present

## 2020-07-21 DIAGNOSIS — Z1231 Encounter for screening mammogram for malignant neoplasm of breast: Secondary | ICD-10-CM | POA: Diagnosis not present

## 2020-08-09 DIAGNOSIS — M1712 Unilateral primary osteoarthritis, left knee: Secondary | ICD-10-CM | POA: Diagnosis not present

## 2020-08-11 DIAGNOSIS — E119 Type 2 diabetes mellitus without complications: Secondary | ICD-10-CM | POA: Diagnosis not present

## 2020-08-17 DIAGNOSIS — Z01419 Encounter for gynecological examination (general) (routine) without abnormal findings: Secondary | ICD-10-CM | POA: Diagnosis not present

## 2020-10-20 DIAGNOSIS — E782 Mixed hyperlipidemia: Secondary | ICD-10-CM | POA: Diagnosis not present

## 2020-10-20 DIAGNOSIS — E1165 Type 2 diabetes mellitus with hyperglycemia: Secondary | ICD-10-CM | POA: Diagnosis not present

## 2020-10-20 DIAGNOSIS — I10 Essential (primary) hypertension: Secondary | ICD-10-CM | POA: Diagnosis not present

## 2020-10-20 DIAGNOSIS — K219 Gastro-esophageal reflux disease without esophagitis: Secondary | ICD-10-CM | POA: Diagnosis not present

## 2020-11-10 DIAGNOSIS — Z6831 Body mass index (BMI) 31.0-31.9, adult: Secondary | ICD-10-CM | POA: Diagnosis not present

## 2020-11-10 DIAGNOSIS — S8992XA Unspecified injury of left lower leg, initial encounter: Secondary | ICD-10-CM | POA: Diagnosis not present

## 2020-11-10 DIAGNOSIS — S80212A Abrasion, left knee, initial encounter: Secondary | ICD-10-CM | POA: Diagnosis not present

## 2020-12-03 DIAGNOSIS — S81812A Laceration without foreign body, left lower leg, initial encounter: Secondary | ICD-10-CM | POA: Diagnosis not present

## 2021-01-10 DIAGNOSIS — E669 Obesity, unspecified: Secondary | ICD-10-CM | POA: Diagnosis not present

## 2021-01-10 DIAGNOSIS — E1165 Type 2 diabetes mellitus with hyperglycemia: Secondary | ICD-10-CM | POA: Diagnosis not present

## 2021-01-10 DIAGNOSIS — I10 Essential (primary) hypertension: Secondary | ICD-10-CM | POA: Diagnosis not present

## 2021-01-12 DIAGNOSIS — G4733 Obstructive sleep apnea (adult) (pediatric): Secondary | ICD-10-CM | POA: Diagnosis not present

## 2021-01-12 DIAGNOSIS — R0602 Shortness of breath: Secondary | ICD-10-CM | POA: Diagnosis not present

## 2021-01-13 DIAGNOSIS — R0602 Shortness of breath: Secondary | ICD-10-CM | POA: Diagnosis not present

## 2021-01-13 DIAGNOSIS — G4733 Obstructive sleep apnea (adult) (pediatric): Secondary | ICD-10-CM | POA: Diagnosis not present

## 2021-03-06 DIAGNOSIS — G4733 Obstructive sleep apnea (adult) (pediatric): Secondary | ICD-10-CM | POA: Diagnosis not present

## 2021-03-11 DIAGNOSIS — E118 Type 2 diabetes mellitus with unspecified complications: Secondary | ICD-10-CM | POA: Diagnosis not present

## 2021-03-15 DIAGNOSIS — Z23 Encounter for immunization: Secondary | ICD-10-CM | POA: Diagnosis not present

## 2021-04-06 DIAGNOSIS — G4733 Obstructive sleep apnea (adult) (pediatric): Secondary | ICD-10-CM | POA: Diagnosis not present

## 2021-05-06 DIAGNOSIS — G4733 Obstructive sleep apnea (adult) (pediatric): Secondary | ICD-10-CM | POA: Diagnosis not present

## 2021-05-10 DIAGNOSIS — J309 Allergic rhinitis, unspecified: Secondary | ICD-10-CM | POA: Diagnosis not present

## 2021-05-10 DIAGNOSIS — R0982 Postnasal drip: Secondary | ICD-10-CM | POA: Diagnosis not present

## 2021-06-06 DIAGNOSIS — G4733 Obstructive sleep apnea (adult) (pediatric): Secondary | ICD-10-CM | POA: Diagnosis not present

## 2021-07-07 DIAGNOSIS — G4733 Obstructive sleep apnea (adult) (pediatric): Secondary | ICD-10-CM | POA: Diagnosis not present

## 2021-07-31 DIAGNOSIS — Z6828 Body mass index (BMI) 28.0-28.9, adult: Secondary | ICD-10-CM | POA: Diagnosis not present

## 2021-07-31 DIAGNOSIS — N3000 Acute cystitis without hematuria: Secondary | ICD-10-CM | POA: Diagnosis not present

## 2021-08-04 DIAGNOSIS — G4733 Obstructive sleep apnea (adult) (pediatric): Secondary | ICD-10-CM | POA: Diagnosis not present

## 2021-08-24 DIAGNOSIS — G629 Polyneuropathy, unspecified: Secondary | ICD-10-CM | POA: Diagnosis not present

## 2021-11-27 DIAGNOSIS — Z1151 Encounter for screening for human papillomavirus (HPV): Secondary | ICD-10-CM | POA: Diagnosis not present

## 2021-11-27 DIAGNOSIS — Z01419 Encounter for gynecological examination (general) (routine) without abnormal findings: Secondary | ICD-10-CM | POA: Diagnosis not present

## 2021-11-28 DIAGNOSIS — Z1231 Encounter for screening mammogram for malignant neoplasm of breast: Secondary | ICD-10-CM | POA: Diagnosis not present

## 2022-03-01 DIAGNOSIS — Z23 Encounter for immunization: Secondary | ICD-10-CM | POA: Diagnosis not present

## 2022-08-10 ENCOUNTER — Other Ambulatory Visit (HOSPITAL_COMMUNITY): Payer: Self-pay

## 2022-08-13 ENCOUNTER — Other Ambulatory Visit (HOSPITAL_COMMUNITY): Payer: Self-pay

## 2022-08-13 MED ORDER — MOUNJARO 7.5 MG/0.5ML ~~LOC~~ SOAJ
15.0000 mg | SUBCUTANEOUS | 3 refills | Status: AC
  Filled 2022-08-13: qty 4, 28d supply, fill #0

## 2022-08-14 ENCOUNTER — Other Ambulatory Visit: Payer: Self-pay

## 2022-08-14 ENCOUNTER — Other Ambulatory Visit (HOSPITAL_COMMUNITY): Payer: Self-pay

## 2022-08-15 ENCOUNTER — Other Ambulatory Visit (HOSPITAL_COMMUNITY): Payer: Self-pay

## 2022-08-15 MED ORDER — MOUNJARO 7.5 MG/0.5ML ~~LOC~~ SOAJ
7.5000 mg | SUBCUTANEOUS | 3 refills | Status: AC
  Filled 2022-08-15: qty 2, 28d supply, fill #0
  Filled 2022-09-12: qty 2, 28d supply, fill #1
  Filled 2022-10-02 – 2022-10-05 (×2): qty 2, 28d supply, fill #2
  Filled 2022-10-23 – 2022-11-02 (×3): qty 2, 28d supply, fill #3
  Filled 2022-11-07 – 2023-03-27 (×12): qty 2, 28d supply, fill #4

## 2022-08-15 MED ORDER — MOUNJARO 7.5 MG/0.5ML ~~LOC~~ SOAJ
15.0000 mg | SUBCUTANEOUS | 3 refills | Status: AC
  Filled 2022-08-15: qty 4, 28d supply, fill #0

## 2022-08-16 ENCOUNTER — Other Ambulatory Visit (HOSPITAL_COMMUNITY): Payer: Self-pay

## 2022-08-17 ENCOUNTER — Other Ambulatory Visit (HOSPITAL_COMMUNITY): Payer: Self-pay

## 2022-08-31 ENCOUNTER — Other Ambulatory Visit (HOSPITAL_COMMUNITY): Payer: Self-pay

## 2022-09-03 ENCOUNTER — Encounter (HOSPITAL_COMMUNITY): Payer: Self-pay

## 2022-09-03 ENCOUNTER — Other Ambulatory Visit (HOSPITAL_COMMUNITY): Payer: Self-pay

## 2022-09-03 DIAGNOSIS — E785 Hyperlipidemia, unspecified: Secondary | ICD-10-CM | POA: Diagnosis not present

## 2022-09-03 DIAGNOSIS — E782 Mixed hyperlipidemia: Secondary | ICD-10-CM | POA: Diagnosis not present

## 2022-09-03 DIAGNOSIS — E1169 Type 2 diabetes mellitus with other specified complication: Secondary | ICD-10-CM | POA: Diagnosis not present

## 2022-09-03 DIAGNOSIS — I10 Essential (primary) hypertension: Secondary | ICD-10-CM | POA: Diagnosis not present

## 2022-09-03 MED ORDER — MOUNJARO 10 MG/0.5ML ~~LOC~~ SOAJ
10.0000 mg | SUBCUTANEOUS | 6 refills | Status: AC
  Filled 2022-09-03 – 2022-09-06 (×4): qty 2, 28d supply, fill #0
  Filled 2022-09-19 – 2022-09-28 (×2): qty 2, 28d supply, fill #1
  Filled 2022-10-12 – 2022-10-26 (×7): qty 2, 28d supply, fill #2
  Filled 2022-11-07 – 2022-11-25 (×3): qty 2, 28d supply, fill #3
  Filled 2022-12-03 – 2023-01-18 (×2): qty 2, 28d supply, fill #4
  Filled 2023-02-20: qty 2, 28d supply, fill #5

## 2022-09-07 ENCOUNTER — Other Ambulatory Visit: Payer: Self-pay

## 2022-09-12 ENCOUNTER — Other Ambulatory Visit (HOSPITAL_COMMUNITY): Payer: Self-pay

## 2022-09-14 ENCOUNTER — Other Ambulatory Visit: Payer: Self-pay

## 2022-09-19 ENCOUNTER — Other Ambulatory Visit (HOSPITAL_COMMUNITY): Payer: Self-pay

## 2022-09-28 ENCOUNTER — Other Ambulatory Visit: Payer: Self-pay

## 2022-10-02 ENCOUNTER — Other Ambulatory Visit: Payer: Self-pay

## 2022-10-08 ENCOUNTER — Other Ambulatory Visit: Payer: Self-pay

## 2022-10-12 ENCOUNTER — Other Ambulatory Visit: Payer: Self-pay

## 2022-10-12 ENCOUNTER — Other Ambulatory Visit (HOSPITAL_COMMUNITY): Payer: Self-pay

## 2022-10-23 ENCOUNTER — Other Ambulatory Visit: Payer: Self-pay

## 2022-10-25 ENCOUNTER — Other Ambulatory Visit: Payer: Self-pay

## 2022-10-26 ENCOUNTER — Other Ambulatory Visit (HOSPITAL_COMMUNITY): Payer: Self-pay

## 2022-11-02 ENCOUNTER — Other Ambulatory Visit (HOSPITAL_COMMUNITY): Payer: Self-pay

## 2022-11-02 ENCOUNTER — Other Ambulatory Visit: Payer: Self-pay

## 2022-11-07 ENCOUNTER — Other Ambulatory Visit: Payer: Self-pay

## 2022-11-14 ENCOUNTER — Other Ambulatory Visit (HOSPITAL_COMMUNITY): Payer: Self-pay

## 2022-11-21 ENCOUNTER — Other Ambulatory Visit: Payer: Self-pay

## 2022-11-26 ENCOUNTER — Encounter (HOSPITAL_COMMUNITY): Payer: Self-pay

## 2022-11-26 ENCOUNTER — Other Ambulatory Visit: Payer: Self-pay

## 2022-11-26 ENCOUNTER — Other Ambulatory Visit (HOSPITAL_COMMUNITY): Payer: Self-pay

## 2022-11-28 ENCOUNTER — Other Ambulatory Visit: Payer: Self-pay

## 2022-12-03 ENCOUNTER — Other Ambulatory Visit: Payer: Self-pay

## 2022-12-05 ENCOUNTER — Other Ambulatory Visit: Payer: Self-pay

## 2022-12-06 ENCOUNTER — Other Ambulatory Visit (HOSPITAL_COMMUNITY): Payer: Self-pay

## 2022-12-10 ENCOUNTER — Other Ambulatory Visit: Payer: Self-pay

## 2022-12-11 ENCOUNTER — Other Ambulatory Visit (HOSPITAL_COMMUNITY): Payer: Self-pay

## 2022-12-12 ENCOUNTER — Other Ambulatory Visit (HOSPITAL_COMMUNITY): Payer: Self-pay

## 2022-12-12 ENCOUNTER — Encounter (HOSPITAL_COMMUNITY): Payer: Self-pay

## 2022-12-22 ENCOUNTER — Other Ambulatory Visit (HOSPITAL_COMMUNITY): Payer: Self-pay

## 2022-12-22 MED ORDER — MOUNJARO 15 MG/0.5ML ~~LOC~~ SOAJ
15.0000 mg | SUBCUTANEOUS | 5 refills | Status: DC
  Filled 2022-12-22: qty 2, 28d supply, fill #0
  Filled 2023-01-04 – 2023-01-12 (×2): qty 2, 28d supply, fill #1
  Filled 2023-02-08: qty 2, 28d supply, fill #2
  Filled 2023-03-07 – 2023-03-09 (×2): qty 2, 28d supply, fill #3
  Filled 2023-04-04 – 2023-04-08 (×2): qty 2, 28d supply, fill #4
  Filled 2023-05-01: qty 2, 28d supply, fill #5

## 2022-12-24 ENCOUNTER — Other Ambulatory Visit (HOSPITAL_COMMUNITY): Payer: Self-pay

## 2023-01-04 ENCOUNTER — Other Ambulatory Visit: Payer: Self-pay

## 2023-01-04 ENCOUNTER — Other Ambulatory Visit (HOSPITAL_COMMUNITY): Payer: Self-pay

## 2023-01-04 ENCOUNTER — Encounter (HOSPITAL_COMMUNITY): Payer: Self-pay

## 2023-01-12 ENCOUNTER — Other Ambulatory Visit (HOSPITAL_COMMUNITY): Payer: Self-pay

## 2023-01-18 ENCOUNTER — Other Ambulatory Visit: Payer: Self-pay

## 2023-01-23 ENCOUNTER — Other Ambulatory Visit: Payer: Self-pay

## 2023-01-24 ENCOUNTER — Other Ambulatory Visit: Payer: Self-pay

## 2023-02-08 ENCOUNTER — Other Ambulatory Visit: Payer: Self-pay

## 2023-02-11 ENCOUNTER — Other Ambulatory Visit: Payer: Self-pay

## 2023-02-11 DIAGNOSIS — J329 Chronic sinusitis, unspecified: Secondary | ICD-10-CM | POA: Diagnosis not present

## 2023-02-11 DIAGNOSIS — Z6823 Body mass index (BMI) 23.0-23.9, adult: Secondary | ICD-10-CM | POA: Diagnosis not present

## 2023-02-20 ENCOUNTER — Other Ambulatory Visit: Payer: Self-pay

## 2023-03-07 ENCOUNTER — Other Ambulatory Visit: Payer: Self-pay

## 2023-03-11 ENCOUNTER — Other Ambulatory Visit: Payer: Self-pay

## 2023-03-18 DIAGNOSIS — Z6823 Body mass index (BMI) 23.0-23.9, adult: Secondary | ICD-10-CM | POA: Diagnosis not present

## 2023-03-18 DIAGNOSIS — J069 Acute upper respiratory infection, unspecified: Secondary | ICD-10-CM | POA: Diagnosis not present

## 2023-03-27 ENCOUNTER — Other Ambulatory Visit: Payer: Self-pay

## 2023-03-28 DIAGNOSIS — Z1231 Encounter for screening mammogram for malignant neoplasm of breast: Secondary | ICD-10-CM | POA: Diagnosis not present

## 2023-04-05 ENCOUNTER — Other Ambulatory Visit (HOSPITAL_COMMUNITY): Payer: Self-pay

## 2023-04-08 ENCOUNTER — Other Ambulatory Visit: Payer: Self-pay

## 2023-04-16 DIAGNOSIS — Z6823 Body mass index (BMI) 23.0-23.9, adult: Secondary | ICD-10-CM | POA: Diagnosis not present

## 2023-04-16 DIAGNOSIS — R053 Chronic cough: Secondary | ICD-10-CM | POA: Diagnosis not present

## 2023-05-01 ENCOUNTER — Other Ambulatory Visit (HOSPITAL_COMMUNITY): Payer: Self-pay

## 2023-05-09 DIAGNOSIS — Z01419 Encounter for gynecological examination (general) (routine) without abnormal findings: Secondary | ICD-10-CM | POA: Diagnosis not present

## 2023-05-28 ENCOUNTER — Other Ambulatory Visit (HOSPITAL_COMMUNITY): Payer: Self-pay

## 2023-05-28 ENCOUNTER — Other Ambulatory Visit (HOSPITAL_BASED_OUTPATIENT_CLINIC_OR_DEPARTMENT_OTHER): Payer: Self-pay

## 2023-05-28 MED ORDER — MOUNJARO 15 MG/0.5ML ~~LOC~~ SOAJ
15.0000 mg | SUBCUTANEOUS | 5 refills | Status: DC
  Filled 2023-05-28 – 2023-07-01 (×3): qty 2, 28d supply, fill #0
  Filled 2023-07-24: qty 2, 28d supply, fill #1
  Filled 2023-08-21: qty 2, 28d supply, fill #2
  Filled 2023-09-18: qty 2, 28d supply, fill #3
  Filled 2023-10-11 – 2023-10-15 (×2): qty 2, 28d supply, fill #4
  Filled 2023-11-08 – 2023-11-16 (×2): qty 2, 28d supply, fill #5

## 2023-05-28 MED ORDER — MOUNJARO 15 MG/0.5ML ~~LOC~~ SOAJ
15.0000 mg | SUBCUTANEOUS | 5 refills | Status: AC
  Filled 2023-05-31: qty 2, 28d supply, fill #0
  Filled 2023-12-11: qty 2, 28d supply, fill #1
  Filled 2024-01-08: qty 2, 28d supply, fill #2
  Filled 2024-02-08: qty 2, 28d supply, fill #3

## 2023-05-29 ENCOUNTER — Other Ambulatory Visit (HOSPITAL_COMMUNITY): Payer: Self-pay

## 2023-05-30 ENCOUNTER — Other Ambulatory Visit (HOSPITAL_COMMUNITY): Payer: Self-pay

## 2023-06-01 ENCOUNTER — Other Ambulatory Visit (HOSPITAL_COMMUNITY): Payer: Self-pay

## 2023-06-26 ENCOUNTER — Other Ambulatory Visit (HOSPITAL_COMMUNITY): Payer: Self-pay

## 2023-06-27 ENCOUNTER — Other Ambulatory Visit (HOSPITAL_COMMUNITY): Payer: Self-pay

## 2023-07-01 ENCOUNTER — Other Ambulatory Visit (HOSPITAL_COMMUNITY): Payer: Self-pay

## 2023-07-01 ENCOUNTER — Other Ambulatory Visit: Payer: Self-pay

## 2023-07-04 ENCOUNTER — Institutional Professional Consult (permissible substitution): Payer: Self-pay | Admitting: Pulmonary Disease

## 2023-07-24 ENCOUNTER — Other Ambulatory Visit (HOSPITAL_COMMUNITY): Payer: Self-pay

## 2023-08-21 ENCOUNTER — Other Ambulatory Visit (HOSPITAL_COMMUNITY): Payer: Self-pay

## 2023-09-18 ENCOUNTER — Other Ambulatory Visit (HOSPITAL_COMMUNITY): Payer: Self-pay

## 2023-10-02 DIAGNOSIS — I10 Essential (primary) hypertension: Secondary | ICD-10-CM | POA: Diagnosis not present

## 2023-10-02 DIAGNOSIS — E782 Mixed hyperlipidemia: Secondary | ICD-10-CM | POA: Diagnosis not present

## 2023-10-02 DIAGNOSIS — E785 Hyperlipidemia, unspecified: Secondary | ICD-10-CM | POA: Diagnosis not present

## 2023-10-02 DIAGNOSIS — E1169 Type 2 diabetes mellitus with other specified complication: Secondary | ICD-10-CM | POA: Diagnosis not present

## 2023-10-02 DIAGNOSIS — M1712 Unilateral primary osteoarthritis, left knee: Secondary | ICD-10-CM | POA: Diagnosis not present

## 2023-10-11 ENCOUNTER — Other Ambulatory Visit (HOSPITAL_COMMUNITY): Payer: Self-pay

## 2023-11-08 ENCOUNTER — Other Ambulatory Visit (HOSPITAL_COMMUNITY): Payer: Self-pay

## 2023-11-18 ENCOUNTER — Other Ambulatory Visit: Payer: Self-pay

## 2023-12-11 ENCOUNTER — Other Ambulatory Visit (HOSPITAL_COMMUNITY): Payer: Self-pay

## 2024-01-03 ENCOUNTER — Other Ambulatory Visit: Payer: Self-pay | Admitting: Medical Genetics

## 2024-01-08 ENCOUNTER — Other Ambulatory Visit (HOSPITAL_COMMUNITY): Payer: Self-pay

## 2024-02-09 ENCOUNTER — Other Ambulatory Visit (HOSPITAL_COMMUNITY): Payer: Self-pay

## 2024-03-11 ENCOUNTER — Other Ambulatory Visit (HOSPITAL_COMMUNITY): Payer: Self-pay

## 2024-03-11 MED ORDER — TIRZEPATIDE 15 MG/0.5ML ~~LOC~~ SOAJ
15.0000 mg | SUBCUTANEOUS | 1 refills | Status: AC
Start: 2024-03-11 — End: ?
  Filled 2024-03-11: qty 2, 28d supply, fill #0
  Filled 2024-04-13: qty 2, 28d supply, fill #1

## 2024-03-12 ENCOUNTER — Other Ambulatory Visit (HOSPITAL_COMMUNITY): Payer: Self-pay

## 2024-03-12 MED ORDER — MOUNJARO 15 MG/0.5ML ~~LOC~~ SOAJ
15.0000 mg | SUBCUTANEOUS | 2 refills | Status: AC
  Filled 2024-03-12: qty 2, 28d supply, fill #0

## 2024-03-16 ENCOUNTER — Other Ambulatory Visit: Payer: Self-pay | Admitting: Medical Genetics

## 2024-03-16 DIAGNOSIS — Z006 Encounter for examination for normal comparison and control in clinical research program: Secondary | ICD-10-CM

## 2024-04-13 ENCOUNTER — Other Ambulatory Visit (HOSPITAL_COMMUNITY): Payer: Self-pay

## 2024-04-13 LAB — GENECONNECT MOLECULAR SCREEN: Genetic Analysis Overall Interpretation: NEGATIVE

## 2024-05-07 ENCOUNTER — Other Ambulatory Visit (HOSPITAL_COMMUNITY): Payer: Self-pay

## 2024-05-07 ENCOUNTER — Other Ambulatory Visit: Payer: Self-pay

## 2024-05-07 MED ORDER — TIRZEPATIDE 15 MG/0.5ML ~~LOC~~ SOAJ
15.0000 mg | SUBCUTANEOUS | 1 refills | Status: AC
  Filled 2024-05-07: qty 2, 28d supply, fill #0
  Filled 2024-06-03: qty 2, 28d supply, fill #1

## 2024-06-03 ENCOUNTER — Other Ambulatory Visit: Payer: Self-pay
